# Patient Record
Sex: Male | Born: 1990 | Hispanic: No | Marital: Single | Smoking: Former smoker
Health system: Southern US, Community
[De-identification: ages and names within clinical notes are randomized; demographics above are authoritative.]

## PROBLEM LIST (undated history)

## (undated) DIAGNOSIS — K219 Gastro-esophageal reflux disease without esophagitis: Secondary | ICD-10-CM

## (undated) DIAGNOSIS — T7840XA Allergy, unspecified, initial encounter: Secondary | ICD-10-CM

## (undated) DIAGNOSIS — R519 Headache, unspecified: Secondary | ICD-10-CM

## (undated) DIAGNOSIS — R51 Headache: Secondary | ICD-10-CM

## (undated) DIAGNOSIS — E079 Disorder of thyroid, unspecified: Secondary | ICD-10-CM

## (undated) DIAGNOSIS — K222 Esophageal obstruction: Secondary | ICD-10-CM

## (undated) HISTORY — DX: Gastro-esophageal reflux disease without esophagitis: K21.9

## (undated) HISTORY — DX: Disorder of thyroid, unspecified: E07.9

## (undated) HISTORY — PX: LAPAROSCOPIC GASTRIC SLEEVE RESECTION: SHX5895

## (undated) HISTORY — DX: Esophageal obstruction: K22.2

## (undated) HISTORY — PX: WISDOM TOOTH EXTRACTION: SHX21

## (undated) HISTORY — DX: Headache: R51

## (undated) HISTORY — PX: TONSILLECTOMY: SUR1361

## (undated) HISTORY — DX: Headache, unspecified: R51.9

## (undated) HISTORY — DX: Allergy, unspecified, initial encounter: T78.40XA

## (undated) HISTORY — PX: UPPER GASTROINTESTINAL ENDOSCOPY: SHX188

---

## 2010-04-17 ENCOUNTER — Ambulatory Visit: Payer: Self-pay | Admitting: Family Medicine

## 2010-04-17 DIAGNOSIS — F17200 Nicotine dependence, unspecified, uncomplicated: Secondary | ICD-10-CM

## 2010-04-17 DIAGNOSIS — H6120 Impacted cerumen, unspecified ear: Secondary | ICD-10-CM

## 2010-06-11 NOTE — Assessment & Plan Note (Signed)
Summary: NEW ACUTE/EAR INF/PT TO EST LATER/[facility]   Vital Signs:  Patient profile:   20 year old male Weight:      245 pounds O2 Sat:      97 % Temp:     98.4 degrees F Pulse rate:   83 / minute BP sitting:   120 / 86  (left arm) Cuff size:   large  Vitals Entered By: Pura Spice, RN (April 17, 2010 12:18 PM) CC: acute  rt ear infection states very painful   History of Present Illness: New pt seen for acute issue of 2 day hx R ear pain. Hx cerumen impaction in past.  Used OTC ear drop last night-without relief. No hearing changes.  No alleviating factors. No exacerbating factors for ear pain.  No chronic med problems. PMH, SH , and FH reviewed.  Preventive Screening-Counseling & Management  Alcohol-Tobacco     Smoking Status: current  Allergies (verified): No Known Drug Allergies  Past History:  Social History: Last updated: 04/17/2010 Single Current Smoker  Risk Factors: Smoking Status: current (04/17/2010)  Past Medical History: no chronic problems.  Past Surgical History: tonsillectomy PMH-FH-SH reviewed for relevance  Social History: Single Current Smoker Smoking Status:  current  Review of Systems  The patient denies anorexia, fever, decreased hearing, and hoarseness.    Physical Exam  General:  Well-developed,well-nourished,in no acute distress; alert,appropriate and cooperative throughout examination Head:  Normocephalic and atraumatic without obvious abnormalities. No apparent alopecia or balding. Eyes:  pupils equal, pupils round, and pupils reactive to light.   Ears:  cerumen impaction R canal.  L normal. After irrigation, TM normal with exception of minimal erythema. Mouth:  Oral mucosa and oropharynx without lesions or exudates.  Teeth in good repair. Neck:  No deformities, masses, or tenderness noted. Lungs:  Normal respiratory effort, chest expands symmetrically. Lungs are clear to auscultation, no crackles or wheezes. Heart:   Normal rate and regular rhythm. S1 and S2 normal without gallop, murmur, click, rub or other extra sounds. Skin:  no rashes.   Cervical Nodes:  No lymphadenopathy noted   Impression & Recommendations:  Problem # 1:  CERUMEN IMPACTION (ICD-380.4) improved with irrigation.  TMs appear normal.  Problem # 2:  CIGARETTE SMOKER (ICD-305.1) rec smoking cessation.   Orders Added: 1)  New Patient Level II [99202]

## 2010-07-06 ENCOUNTER — Encounter: Payer: Self-pay | Admitting: Family Medicine

## 2010-07-06 ENCOUNTER — Ambulatory Visit (INDEPENDENT_AMBULATORY_CARE_PROVIDER_SITE_OTHER): Payer: PRIVATE HEALTH INSURANCE | Admitting: Family Medicine

## 2010-07-06 VITALS — BP 120/70 | Temp 98.8°F | Ht 69.5 in | Wt 260.0 lb

## 2010-07-06 DIAGNOSIS — M79673 Pain in unspecified foot: Secondary | ICD-10-CM

## 2010-07-06 DIAGNOSIS — M79609 Pain in unspecified limb: Secondary | ICD-10-CM

## 2010-07-06 MED ORDER — NAPROXEN 500 MG PO TABS: 500.0000 mg | ORAL_TABLET | Freq: Two times a day (BID) | ORAL | Status: DC

## 2010-07-06 NOTE — Patient Instructions (Signed)
Ice foot 2-3 times daily. Use good supportive shoewear. Touch base in 3 weeks if no better.

## 2010-07-06 NOTE — Progress Notes (Signed)
  Subjective:    Patient ID: Keith Copeland, male    DOB: 1990/09/16, 20 y.o.   MRN: [id number]  HPI  patient seen with right dorsal foot pain one half week duration. No injury. Works at the [employer] on feet several hours per day. Pain is more forefoot area. No redness or warmth. No swelling. Has taken no medications. Pain worse when pushing off. No prior history of foot problem. Pain is moderate severity   Review of Systems  as per history of present illness    Objective:   Physical Exam  the patient is alert and in no distress Chest clear to auscultation Heart regular rhythm and rate  right lower extremity foot exam reveals no edema, erythema, or warmth. No ecchymosis. Minimally tender but poorly localized proximal foot dorsally. No metatarsal tenderness. Full range of motion of ankle no Achilles tenderness. No tibia or fibula tenderness       Assessment & Plan:   probable tendinitis right foot.   Recommend good supportive shoe wear. Ice 2-3 times daily. Naproxen 500 mg twice a day with food.3 weeks if no better

## 2010-10-18 ENCOUNTER — Emergency Department (HOSPITAL_COMMUNITY)
Admission: EM | Admit: 2010-10-18 | Discharge: 2010-10-19 | Disposition: A | Discharge status: Home / Self Care | Payer: PRIVATE HEALTH INSURANCE | Attending: Emergency Medicine | Admitting: Emergency Medicine

## 2010-10-18 DIAGNOSIS — T18108A Unspecified foreign body in esophagus causing other injury, initial encounter: Secondary | ICD-10-CM | POA: Insufficient documentation

## 2010-10-18 DIAGNOSIS — IMO0002 Reserved for concepts with insufficient information to code with codable children: Secondary | ICD-10-CM | POA: Insufficient documentation

## 2010-10-19 ENCOUNTER — Emergency Department (HOSPITAL_COMMUNITY): Payer: PRIVATE HEALTH INSURANCE

## 2010-10-19 ENCOUNTER — Encounter: Payer: Self-pay | Admitting: Internal Medicine

## 2010-10-19 LAB — BASIC METABOLIC PANEL
Calcium: 9.6 mg/dL (ref 8.4–10.5)
GFR calc Af Amer: >60 mL/min (ref >60)
GFR calc non Af Amer: >60 mL/min (ref >60)
Sodium: 138 mEq/L (ref 135–145)

## 2010-10-19 LAB — CBC
MCH: 30.4 pg (ref 26.0–34.0)
MCHC: 34.4 g/dL (ref 30.0–36.0)
Platelets: 192 10*3/uL (ref 150–400)
RBC: 4.6 MIL/uL (ref 4.22–5.81)
RDW: 12.9 % (ref 11.5–15.5)

## 2010-11-04 ENCOUNTER — Ambulatory Visit (INDEPENDENT_AMBULATORY_CARE_PROVIDER_SITE_OTHER): Payer: PRIVATE HEALTH INSURANCE | Admitting: Family Medicine

## 2010-11-04 ENCOUNTER — Encounter: Payer: Self-pay | Admitting: Family Medicine

## 2010-11-04 VITALS — BP 120/80 | Temp 98.0°F | Wt 265.0 lb

## 2010-11-04 DIAGNOSIS — B356 Tinea cruris: Secondary | ICD-10-CM

## 2010-11-04 MED ORDER — NAFTIFINE HCL 1 % EX GEL: 1.0000 "application " | Freq: Two times a day (BID) | CUTANEOUS | Status: DC

## 2010-11-04 NOTE — Patient Instructions (Signed)
Tinea Cruris (Jock Itch, Fungal Infection of the Groin) Tinea Cruris (Jock Itch) is a fungal infection of the skin in the groin area. It is sometimes called "ringworm" even though it is not due to a worm. A fungus is a type of germ that thrives in dark, damp places.  CAUSES This infection may spread from:  A fungus infection elsewhere on the body (such as athlete's foot)   Sharing towels, clothing, etc.  This infection is more common in:  Hot, humid climates.   People who wear tight-fitting clothing or wet bathing suits for long periods of time.   Athletes.   Overweight people.   Diabetics.  SYMPTOMS Tinea cruris causes the following symptoms:  Red, pink or brown rash in the groin. Rash may spread to the thighs, anus and buttocks.   Itching  DIAGNOSIS Your caregiver may make the diagnosis by looking at the rash. Sometimes a skin scraping will be sent to test for fungus. Testing can be done either by looking under the microscope or by doing a culture (test to try to grow the fungus). A culture can take up to 2 weeks to come back. TREATMENT Tinea cruris may be treated with:  Skin cream or ointment to kill fungus.   Medicine by mouth to kill fungus.   Skin cream or ointment to calm the itching.   Compresses or medicated powders to dry the infected skin.  HOME CARE INSTRUCTIONS  Be sure to treat the rash completely - follow your caregiver's instructions. It can take a couple of weeks to treat. If you do not treat long enough, the rash can come back.   Wear loose fitting clothing.   Men should wear cotton boxer shorts   Women should wear cotton underwear.   Avoid hot baths.   Dry the groin area well after bathing.  SEEK MEDICAL CARE IF:  The rash is worse.   The rash is spreading.   The rash returns after treatment is finished.   The rash is not gone in 4 weeks. Fungal infections are slow to respond to treatment. Some redness may remain for several weeks after  the fungus is gone.   You develop a fever of more than 100.83F (38.1 C).  SEEK IMMEDIATE MEDICAL ATTENTION IF:  The area becomes red, warm, tender, and swollen.   An unexplained oral temperature above 100 develops.  Document Released: 04/16/2002 Document Re-Released: 07/21/2009 [facility] Patient Information 2011 [place], [place].  May consider trying Lotrimin or Lamisil over the counter twice daily and go to Naftin if no better with those.

## 2010-11-04 NOTE — Progress Notes (Signed)
  Subjective:    Patient ID: Keith Copeland, male    DOB: [birth year], 20 y.o.   MRN: [id number]  HPI Two-week history of rash groin region. Rash is pruritic. No pustules. Minimally scaly. Works and he and frequent sweats a lot. Has tried Desitin, cortisone and Gold Bond without improvement. No associated pain. No alleviating factors.   Review of Systems  Constitutional: Negative for fever and chills.  Skin: Positive for rash.       Objective:   Physical Exam  Constitutional: He appears well-developed and well-nourished. No distress.  Cardiovascular: Normal rate, regular rhythm and normal heart sounds.   Pulmonary/Chest: Effort normal and breath sounds normal. No respiratory distress. He has no wheezes. He has no rales.  Musculoskeletal: He exhibits no edema.  Skin:       Patient has rash in groin region symmetric distribution. Erythematous base with some satellite lesions. No pustules. Minimally scaly.          Assessment & Plan:  Tenia cruris. Prevention measures discussed. Try over-the-counter Lotrimin or Lamisil cream. No relief try Naftin gel.

## 2010-12-01 ENCOUNTER — Encounter: Payer: Self-pay | Admitting: Internal Medicine

## 2010-12-01 ENCOUNTER — Ambulatory Visit (INDEPENDENT_AMBULATORY_CARE_PROVIDER_SITE_OTHER): Payer: PRIVATE HEALTH INSURANCE | Admitting: Internal Medicine

## 2010-12-01 DIAGNOSIS — R1310 Dysphagia, unspecified: Secondary | ICD-10-CM

## 2010-12-01 DIAGNOSIS — K219 Gastro-esophageal reflux disease without esophagitis: Secondary | ICD-10-CM

## 2010-12-01 MED ORDER — OMEPRAZOLE 40 MG PO CPDR: 40.0000 mg | DELAYED_RELEASE_CAPSULE | Freq: Every day | ORAL | Status: DC

## 2010-12-01 NOTE — Patient Instructions (Signed)
EGD with propoful [facility] 12/08/10 4:00 pm arrive at 3:00 pm on 4th floor Endoscopy brochure given along with Dilation information.

## 2010-12-01 NOTE — Progress Notes (Signed)
HISTORY OF PRESENT ILLNESS:  Keith Copeland is a 20 y.o. male with morbid obesity who presents today, upon referral from the emergency room physician, regarding chronic intermittent problems with dysphagia. He is accompanied by his girlfriend. Patient reports at least a 6 year history of intermittent solid food dysphagia. Severe at times. Always requiring liquids along with his meals. Eats very slowly. Evaluated in [place] by a non-specialist. Apparently had a barium swallow and was recommended to have tonsillectomy, which he didn't 4 years ago. This did not change his symptoms. No other evaluation. He does have occasional intermittent problems with heartburn which has been worse recently. He was seen in the emergency room for an acute food impaction, which resolved spontaneously, around 10/19/2010. CBC and comprehensive metabolic panel were unremarkable.  REVIEW OF SYSTEMS:  All non-GI ROS negative.  Past Medical History  Diagnosis Date  . Dysphagia   . Diabetes mellitus     diet controlled/weight loss    Past Surgical History  Procedure Date  . Tonsillectomy     Social History Jakori Burkett  reports that he has been smoking Cigarettes.  He has a .3 pack-year smoking history. He does not have any smokeless tobacco history on file. He reports that he does not drink alcohol or use illicit drugs.  family history includes Breast cancer in his paternal grandmother and Diabetes in his paternal grandmother.  No Known Allergies     PHYSICAL EXAMINATION: Vital signs: BP 124/82  Pulse 90  Ht 5\' 9"  (1.753 m)  Wt 266 lb (120.657 kg)  BMI 39.28 kg/m2  Constitutional: generally well-appearing but obese, no acute distress Psychiatric: alert and oriented x3, cooperative Eyes: extraocular movements intact, anicteric, conjunctiva pink Mouth: oral pharynx moist, no lesions Neck: supple no lymphadenopathy Cardiovascular: heart regular rate and rhythm, no murmur Lungs: clear to auscultation  bilaterally Abdomen: soft, obese, nontender, nondistended, no obvious ascites, no peritoneal signs, normal bowel sounds, no organomegaly Extremities: no lower extremity edema bilaterally Skin: no lesions on visible extremities Neuro: No focal deficits.   ASSESSMENT:  #1. Intermittent solid food dysphagia with recurrent food impaction. Rule out eosinophilic esophagitis. Rule out peptic stricture. We discussed these entities in detail. #2. GERD  PLAN:  #1. Upper endoscopy with probable esophageal dilation.The nature of the procedure, as well as the risks, benefits, and alternatives were carefully and thoroughly reviewed with the patient. Ample time for discussion and questions allowed. The patient understood, was satisfied, and agreed to proceed. Patient would be most appropriately served with propofol/CRNA due to his obesity I discussed this with him. #2. Prescribe omeprazole 40 mg daily #3. Reflux percussions with attention to weight loss

## 2010-12-08 ENCOUNTER — Encounter: Payer: Self-pay | Admitting: *Deleted

## 2010-12-08 ENCOUNTER — Encounter: Payer: Self-pay | Admitting: Internal Medicine

## 2010-12-08 ENCOUNTER — Ambulatory Visit (AMBULATORY_SURGERY_CENTER): Payer: PRIVATE HEALTH INSURANCE | Admitting: Internal Medicine

## 2010-12-08 VITALS — BP 109/53 | HR 63 | Temp 97.3°F | Resp 19 | Ht 69.5 in | Wt 260.0 lb

## 2010-12-08 DIAGNOSIS — K2980 Duodenitis without bleeding: Secondary | ICD-10-CM

## 2010-12-08 DIAGNOSIS — K222 Esophageal obstruction: Secondary | ICD-10-CM

## 2010-12-08 MED ORDER — SODIUM CHLORIDE 0.9 % IV SOLN: 500.0000 mL | INTRAVENOUS | Status: DC

## 2010-12-08 NOTE — Patient Instructions (Signed)
See the picture page for the findings of your upper endoscopy.  Please follow the instructions sheets that are green and blue the rest of the day.  Also follow the dilatation diet the rest of the day.  Resume your prior medicationsincluding the OMEPRAZOLE 40 mg daily.  Please call the office tomarrow to set up a follow up visit with Dr. Marina Goodell for 4-6 weeks.  Call if any questions or concerns.

## 2010-12-08 NOTE — Progress Notes (Signed)
No complaint on discharge.  MAW

## 2010-12-09 ENCOUNTER — Telehealth: Payer: Self-pay | Admitting: *Deleted

## 2010-12-09 NOTE — Telephone Encounter (Signed)
Left message to call us if needed.

## 2010-12-10 DIAGNOSIS — K2980 Duodenitis without bleeding: Secondary | ICD-10-CM

## 2010-12-10 LAB — HELICOBACTER PYLORI SCREEN-BIOPSY: UREASE: NEGATIVE

## 2011-01-06 ENCOUNTER — Ambulatory Visit: Payer: PRIVATE HEALTH INSURANCE | Admitting: Internal Medicine

## 2011-01-06 ENCOUNTER — Encounter: Payer: Self-pay | Admitting: *Deleted

## 2011-04-30 ENCOUNTER — Encounter: Payer: Self-pay | Admitting: Internal Medicine

## 2011-04-30 ENCOUNTER — Ambulatory Visit (INDEPENDENT_AMBULATORY_CARE_PROVIDER_SITE_OTHER): Payer: PRIVATE HEALTH INSURANCE | Admitting: Internal Medicine

## 2011-04-30 DIAGNOSIS — K200 Eosinophilic esophagitis: Secondary | ICD-10-CM

## 2011-04-30 DIAGNOSIS — R1310 Dysphagia, unspecified: Secondary | ICD-10-CM

## 2011-04-30 DIAGNOSIS — K222 Esophageal obstruction: Secondary | ICD-10-CM

## 2011-04-30 NOTE — Patient Instructions (Signed)
You have been scheduled for an endoscopy with propofol. Please follow written instructions given to you at your visit today.  

## 2011-04-30 NOTE — Progress Notes (Signed)
HISTORY OF PRESENT ILLNESS:  Keith Copeland is a 20 y.o. male with chronic recurrent dysphagia. He was last seen on 12/08/2010 when he underwent upper endoscopy. Examination revealed esophageal changes consistent with eosinophilic esophagitis. He was dilated with 48 and 52 [place] dilators. He was prescribed omeprazole 40 mg daily and asked to followup in 4-6 weeks. He did not followup. He presents today with recurrent intermittent solid food dysphagia 3-4 weeks' duration. He is accompanied by his girlfriend. He has been suboptimally compliant with omeprazole stating that he takes it once or twice per week. No classic GERD symptoms. No interval problems.  REVIEW OF SYSTEMS:  All non-GI ROS negative.  Past Medical History  Diagnosis Date  . Dysphagia   . Allergy     poison ivy/oak  . Esophagitis   . Duodenitis   . Esophageal ring     Past Surgical History  Procedure Date  . Tonsillectomy     Social History Keith Copeland  reports that he has been smoking Cigarettes.  He has a .3 pack-year smoking history. He has never used smokeless tobacco. He reports that he drinks about 1.8 ounces of alcohol per week. He reports that he does not use illicit drugs.  family history includes Breast cancer in his paternal grandmother and Diabetes in his paternal grandmother.  No Known Allergies     PHYSICAL EXAMINATION: Vital signs: BP 124/72  Pulse 60  Ht 5\' 9"  (1.753 m)  Wt 263 lb (119.296 kg)  BMI 38.84 kg/m2 General: Obese. Well-developed, well-nourished, no acute distress HEENT: Sclerae are anicteric, conjunctiva pink. Oral mucosa intact Lungs: Clear Heart: Regular Abdomen: soft, obese nontender, nondistended, no obvious ascites, no peritoneal signs, normal bowel sounds. No organomegaly. Extremities: No edema Psychiatric: alert and oriented x3. Cooperative   ASSESSMENT:  #1. Recurrent dysphagia likely due to recurrent esophageal stricture/ring formation. Prior endoscopy with changes  consistent with eosinophilic esophagitis.  PLAN:  #1. Instructed to take omeprazole 40 mg DAILY #2. Schedule upper endoscopy with esophageal dilation.The nature of the procedure, as well as the risks, benefits, and alternatives were carefully and thoroughly reviewed with the patient. Ample time for discussion and questions allowed. The patient understood, was satisfied, and agreed to proceed. chew food slowly and carefully in the interim

## 2011-05-10 ENCOUNTER — Encounter: Payer: Self-pay | Admitting: *Deleted

## 2011-05-10 ENCOUNTER — Ambulatory Visit (AMBULATORY_SURGERY_CENTER): Payer: PRIVATE HEALTH INSURANCE | Admitting: Internal Medicine

## 2011-05-10 ENCOUNTER — Encounter: Payer: Self-pay | Admitting: Internal Medicine

## 2011-05-10 VITALS — BP 119/78 | HR 67 | Temp 97.0°F | Resp 16 | Ht 69.0 in | Wt 263.0 lb

## 2011-05-10 DIAGNOSIS — R1310 Dysphagia, unspecified: Secondary | ICD-10-CM

## 2011-05-10 DIAGNOSIS — K219 Gastro-esophageal reflux disease without esophagitis: Secondary | ICD-10-CM

## 2011-05-10 DIAGNOSIS — K222 Esophageal obstruction: Secondary | ICD-10-CM

## 2011-05-10 MED ORDER — SODIUM CHLORIDE 0.9 % IV SOLN: 500.0000 mL | INTRAVENOUS | Status: DC

## 2011-05-10 NOTE — Op Note (Signed)
[facility] [address] [place], [place]  [address]  ENDOSCOPY PROCEDURE REPORT  PATIENT:  Keith Copeland, Keith Copeland  MR#:  [id number] BIRTHDATE:  Oct 03, 1990, 20 yrs. old  GENDER:  male  ENDOSCOPIST:  Wilhemina Bonito. Eda Keys, MD Referred by:  Office  PROCEDURE DATE:  05/10/2011 PROCEDURE:  EGD, diagnostic 43235, [place] Dilation of Esophagus - 48, 37F ASA CLASS:  Class I INDICATIONS:  dilation of esophageal stricture, dysphagia, GERD  MEDICATIONS:   MAC sedation, administered by CRNA, propofol (Diprivan) 300 mg IV TOPICAL ANESTHETIC:  none  DESCRIPTION OF PROCEDURE:   After the risks benefits and alternatives of the procedure were thoroughly explained, informed consent was obtained.  The LB GIF-H180 [id number] endoscope was introduced through the mouth and advanced to the second portion of the duodenum, without limitations.  The instrument was slowly withdrawn as the mucosa was fully examined. <<PROCEDUREIMAGES>>  Multiple rings were seen in the total esophagus as previous. Distal  15mm stricture with inflammation.  Otherwise the examination to D2 was normal.    Retroflexed views revealed no abnormalities.  THERAPY: 42F DILATOR PASSED W/ MINIMAL RESISTANCE. NO HEME. 37F DILATOR PASSED W/ MINIMAL RESISTANCE AND HEME. RELOOK EGD WITH CONTAINED WRENT IN DISTAL ESOPHAGUS  COMPLICATIONS:  None  ENDOSCOPIC IMPRESSION: 1) Rings, multiple in the total esophagus - S/P DILATION 2) Otherwise normal examination  RECOMMENDATIONS: 1) NPO UNTIL 12 NOON. Then Clear liquids until 5 pm, then soft foods rest of day. Resume prior diet tomorrow. 2) Continue OMEPRAZOLE DAILY 3) Follow-up o.v  IN 4-6 WEEKS  ______________________________ Wilhemina Bonito. Eda Keys, MD  CC:  The Patient;  Evelena Peat, MD  n. Rosalie DoctorWilhemina Bonito. Eda Keys at 05/10/2011 10:29 AM  [place], [place], [phone]

## 2011-05-10 NOTE — Progress Notes (Signed)
Patient did not experience any of the following events: a burn prior to discharge; a fall within the facility; wrong site/side/patient/procedure/implant event; or a hospital transfer or hospital admission upon discharge from the facility. (G8907) Patient did not have preoperative order for IV antibiotic SSI prophylaxis. (G8918)  

## 2011-05-10 NOTE — Patient Instructions (Signed)
FOLLOW THE DISCHARGE INSTRUCTIONS ON THE GREEN AND BLUE INSTRUCTION SHEETS.  FOLLOW DILATATION DIET:  NOTHING TO EAT OR DRINK UNTIL 1200 TODAY. CLEAR LIQUIDS ONLY FROM 1200 UNTIL 5 PM. AFTER 5 PM SOFT FOODS ONLY. RESUME YOUR REGULAR DIET TOMORROW.  CONTINUE OMPEPRAZOLE DAILY.  FOLLOW UP APPOINTMENT IN 4-6 WEEKS WITH DR. PERRY.

## 2011-05-12 ENCOUNTER — Telehealth: Payer: Self-pay

## 2011-05-12 NOTE — Telephone Encounter (Signed)
Follow up Call- Patient questions:  Do you have a fever, pain , or abdominal swelling? no Pain Score  0 *  Have you tolerated food without any problems? yes  Have you been able to return to your normal activities? yes  Do you have any questions about your discharge instructions: Diet   no Medications  no Follow up visit  no  Do you have questions or concerns about your Care? no  Actions: * If pain score is 4 or above: No action needed, pain <4.  I asked how the pt was today he commented "I'm better now".  He said he had chest pain after his procedure.  I asked if he called the emergency number on his discharge papers.  He said "no, it went away".  I asked if he was having any chest pain now, he replied, "no, not now".  I advised him to call us back if the chest pain came back or if any other questions or concerns.  MAW

## 2011-06-07 ENCOUNTER — Encounter: Payer: Self-pay | Admitting: Internal Medicine

## 2011-06-07 ENCOUNTER — Ambulatory Visit (INDEPENDENT_AMBULATORY_CARE_PROVIDER_SITE_OTHER): Payer: PRIVATE HEALTH INSURANCE | Admitting: Internal Medicine

## 2011-06-07 DIAGNOSIS — K219 Gastro-esophageal reflux disease without esophagitis: Secondary | ICD-10-CM

## 2011-06-07 DIAGNOSIS — K200 Eosinophilic esophagitis: Secondary | ICD-10-CM

## 2011-06-07 DIAGNOSIS — K222 Esophageal obstruction: Secondary | ICD-10-CM

## 2011-06-07 DIAGNOSIS — R1310 Dysphagia, unspecified: Secondary | ICD-10-CM

## 2011-06-07 NOTE — Patient Instructions (Signed)
Please follow up as needed 

## 2011-06-07 NOTE — Progress Notes (Signed)
HISTORY OF PRESENT ILLNESS:  Keith Copeland is a 21 y.o. male with eosinophilic esophagitis and GERD complicated by multiple esophageal rings and stricture with associated significant dysphagia. He was last seen 05/10/2011 when he underwent upper endoscopy with [facility] dilation of the esophagus with 48 and 52 [place] dilators. Post dilation he has been continued on omeprazole 40 mg daily. He presents today for followup. He is pleased to report marked improvement in his swallowing. Really having no problems. He has been compliant with omeprazole. Has noticed increased belching.  REVIEW OF SYSTEMS:  All non-GI ROS negative.  Past Medical History  Diagnosis Date  . Dysphagia   . Allergy     poison ivy/oak  . Esophagitis   . Duodenitis   . Esophageal ring     Past Surgical History  Procedure Date  . Tonsillectomy     Social History Keith Copeland  reports that he quit smoking about 4 months ago. His smoking use included Cigarettes. He has a .3 pack-year smoking history. He has never used smokeless tobacco. He reports that he drinks about 1.8 ounces of alcohol per week. He reports that he does not use illicit drugs.  family history includes Breast cancer in his paternal grandmother and Diabetes in his paternal grandmother.  No Known Allergies     PHYSICAL EXAMINATION: Vital signs: BP 110/62  Pulse 76  Ht 5\' 9"  (1.753 m)  Wt 270 lb (122.471 kg)  BMI 39.87 kg/m2 General: Well-developed, well-nourished, no acute distress HEENT: Sclerae are anicteric, conjunctiva pink. Oral mucosa intact Lungs: Clear Heart: Regular Abdomen: soft, nontender, nondistended, no obvious ascites, no peritoneal signs, normal bowel sounds. No organomegaly. Extremities: No edema Psychiatric: alert and oriented x3. Cooperative     ASSESSMENT:  #1. Eosinophilic esophagitis with ringed esophagus #2. GERD with peptic stricture #3. Status post esophageal dilation with improvement in  dysphagia  PLAN:  #1. Continue reflux precautions #2. Continue omeprazole 40 mg daily #3. Return as needed for recurrent dysphagia. High likelihood with this diagnosis. Otherwise, routine followup in one year

## 2011-08-11 ENCOUNTER — Encounter: Payer: Self-pay | Admitting: Family Medicine

## 2011-08-11 ENCOUNTER — Ambulatory Visit (INDEPENDENT_AMBULATORY_CARE_PROVIDER_SITE_OTHER): Payer: PRIVATE HEALTH INSURANCE | Admitting: Family Medicine

## 2011-08-11 VITALS — BP 120/70 | Temp 98.9°F | Wt 280.0 lb

## 2011-08-11 DIAGNOSIS — N529 Male erectile dysfunction, unspecified: Secondary | ICD-10-CM

## 2011-08-11 MED ORDER — TADALAFIL 20 MG PO TABS: ORAL_TABLET | ORAL | Status: DC

## 2011-08-11 NOTE — Progress Notes (Signed)
  Subjective:    Patient ID: Keith Copeland, male    DOB: [birth year], 21 y.o.   MRN: [id number]  HPI  Patient seen with recent problems of erectile dysfunction. He has good libido. He quit smoking recently. No excessive alcohol use. No illicit drug use. He is able to get erection but has difficulty maintaining. No history of diabetes. No history of any vascular issues. Denies depression. Only other medication is omeprazole for reflux.   Review of Systems  Constitutional: Negative for appetite change and unexpected weight change.  Respiratory: Negative for shortness of breath.   Cardiovascular: Negative for chest pain.  Genitourinary: Negative for dysuria, hematuria, penile swelling, penile pain and testicular pain.       Objective:   Physical Exam  Constitutional: He appears well-developed and well-nourished.  Neck: Neck supple. No thyromegaly present.  Cardiovascular: Normal rate and regular rhythm.   No murmur heard. Pulmonary/Chest: Effort normal and breath sounds normal. No respiratory distress. He has no wheezes. He has no rales.  Musculoskeletal: He exhibits no edema.  Lymphadenopathy:    He has no cervical adenopathy.          Assessment & Plan:  Erectile dysfunction. We discussed various factors. Suspect he has some non-organic factors. Continue to abstain from nicotine. Work on regular exercise and weight loss. Trial of Cialis 20 mg one half to one tablet every other day as needed and I suspect he'll be able to taper off soon. Consider further evaluation if not seeing great response with medication

## 2011-09-17 ENCOUNTER — Ambulatory Visit (INDEPENDENT_AMBULATORY_CARE_PROVIDER_SITE_OTHER): Payer: PRIVATE HEALTH INSURANCE | Admitting: Family Medicine

## 2011-09-17 ENCOUNTER — Encounter: Payer: Self-pay | Admitting: Family Medicine

## 2011-09-17 VITALS — BP 140/88 | Temp 98.8°F | Wt 280.0 lb

## 2011-09-17 DIAGNOSIS — G44229 Chronic tension-type headache, not intractable: Secondary | ICD-10-CM

## 2011-09-17 MED ORDER — CYCLOBENZAPRINE HCL 5 MG PO TABS: ORAL_TABLET | ORAL | Status: DC

## 2011-09-17 NOTE — Patient Instructions (Signed)
Tension Headache (Muscle Contraction Headache) Tension headache is one of the most common causes of head pain. These headaches are usually felt as a pain over the top of your head and back of your neck. Stress, anxiety, and depression are common triggers for these headaches. Tension headaches are not life-threatening and will not lead to other types of headaches. Tension headaches can often be diagnosed by taking a history from the patient and a physical exam. Sometimes, further lab and x-ray studies are used to confirm the diagnosis. Your caregiver can advise you on how to get help solving problems that cause anxiety or stress. Antidepressants can be prescribed if depression is a problem. HOME CARE INSTRUCTIONS   If testing was done, call for your results. Remember, it is your responsibility to get the results of all testing. Do not assume everything is fine because you do not hear from your caregiver.   Only take over-the-counter or prescription medicines for pain, discomfort, or fever as directed by your caregiver.   Biofeedback, massage, or other relaxation techniques may be helpful.   Ice packs or heat to the head and neck can be used. Use these three to four times per day or as needed.   Physical therapy may be a useful addition to treatment.   If headaches continue, even with therapy, you may need to think about lifestyle changes.   Avoid excessive use of pain killers, as rebound headaches can occur.  SEEK MEDICAL CARE IF:   You develop problems with medications prescribed.   You do not respond or get no relief from medications.   You have a change from the usual headache.   You develop nausea (feeling sick to your stomach) or vomiting.  SEEK IMMEDIATE MEDICAL CARE IF:   Your headache becomes severe.   You have an unexplained oral temperature above 102 F (38.9 C).   You develop a stiff neck.   You have loss of vision.   You have muscular weakness.   You have loss of  muscular control.   You develop severe symptoms different from your first symptoms.   You start losing your balance or have trouble walking.   You feel faint or pass out.  MAKE SURE YOU:   Understand these instructions.   Will watch your condition.   Will get help right away if you are not doing well or get worse.  Document Released: 04/26/2005 Document Revised: 04/15/2011 Document Reviewed: 12/14/2007 [facility] Patient Information 2012 [place], [place].  Avoid regular use of analgesics for headache

## 2011-09-17 NOTE — Progress Notes (Signed)
  Subjective:    Patient ID: Keith Copeland, male    DOB: [birth year], 21 y.o.   MRN: [id number]  HPI  Patient seen with headaches for the past couple of weeks. Location is somewhat bilateral. Starts occipital and spreads frontal region. Relatively constant and daily for 2 weeks. Had prior history reported migraines and these are somewhat different. He is not having nausea or vomiting. No photophobia. No visual disturbance. No focal weakness. No known injury. Denies any fever chills. No stiff neck. He has had some neck soreness. Job requires a lot of lifting. In general poor sleep. About 6 hours sleep per night. He's been taking acetaminophen up to 5 500 mg tablets twice daily which helps temporarily. He is cautioned about not taking regular analgesics especially not using excessive acetaminophen. Denies any focal weakness or any focal neurologic symptoms. No exertional headache.  Past Medical History  Diagnosis Date  . Dysphagia   . Allergy     poison ivy/oak  . Esophagitis   . Duodenitis   . Esophageal ring    Past Surgical History  Procedure Date  . Tonsillectomy     reports that he quit smoking about 7 months ago. His smoking use included Cigarettes. He has a .3 pack-year smoking history. He has never used smokeless tobacco. He reports that he drinks about 1.8 ounces of alcohol per week. He reports that he does not use illicit drugs. family history includes Breast cancer in his paternal grandmother and Diabetes in his paternal grandmother. No Known Allergies    Review of Systems  Constitutional: Negative for fever, chills, appetite change, fatigue and unexpected weight change.  Eyes: Negative for visual disturbance.  Respiratory: Negative for cough.   Cardiovascular: Negative for chest pain.  Neurological: Positive for headaches. Negative for dizziness, weakness and numbness.  Psychiatric/Behavioral: Negative for dysphoric mood.       Objective:   Physical Exam  Constitutional:  He is oriented to person, place, and time. He appears well-developed and well-nourished. No distress.  Neck: Neck supple. No thyromegaly present.  Cardiovascular: Normal rate and regular rhythm.   Pulmonary/Chest: Effort normal and breath sounds normal. No respiratory distress. He has no wheezes. He has no rales.  Musculoskeletal: He exhibits no edema.  Lymphadenopathy:    He has no cervical adenopathy.  Neurological: He is alert and oriented to person, place, and time. No cranial nerve deficit. Coordination normal.  Psychiatric: He has a normal mood and affect.          Assessment & Plan:  Headaches. Suspect chronic tension-type headache. Avoid regular use of analgesics. Start Flexeril 5 mg 1-2 each bedtime. Try conservative measures such as heat and muscle massage.  Consider low-dose try cyclic if not seeing improvement with the above of the next couple of weeks

## 2012-10-05 ENCOUNTER — Telehealth: Payer: Self-pay | Admitting: Internal Medicine

## 2012-10-06 ENCOUNTER — Telehealth: Payer: Self-pay

## 2012-10-06 NOTE — Telephone Encounter (Signed)
Pt called and states he wants to schedule a procedure to have his "throat stretched again." States it is getting difficult to swallow again. Pt last seen 06/07/11, last EGD 05/10/11. Dr. Marina Goodell do you want the pt to have an OV first? Please advise.

## 2012-10-06 NOTE — Telephone Encounter (Signed)
He can be set up for EGD with dilation in the [facility] directly. Make sure he is taking omeprazole 40 mg daily, every day. Thank you

## 2012-10-06 NOTE — Telephone Encounter (Signed)
Called pt to see if he could come for EGD on Monday. Pt states he can come for EGD at 3pm for 4pm EGD on Monday. Pt instructed to have clear liquids only after midnight. Pt verbalized understanding.

## 2012-10-06 NOTE — Telephone Encounter (Signed)
Pt states he is taking omeprazole 40mg  daily. Pt scheduled for previsit 10/09/12@8am , EGD with dil 10/11/12@4pm  in the [facility]. Pt aware of appt dates and times.

## 2012-10-09 ENCOUNTER — Encounter: Payer: Self-pay | Admitting: Internal Medicine

## 2012-10-09 ENCOUNTER — Ambulatory Visit (AMBULATORY_SURGERY_CENTER): Payer: PRIVATE HEALTH INSURANCE | Admitting: *Deleted

## 2012-10-09 ENCOUNTER — Ambulatory Visit (AMBULATORY_SURGERY_CENTER): Payer: PRIVATE HEALTH INSURANCE | Admitting: Internal Medicine

## 2012-10-09 VITALS — Ht 69.5 in | Wt 285.4 lb

## 2012-10-09 VITALS — BP 105/62 | HR 62 | Temp 96.9°F | Resp 19 | Ht 69.0 in | Wt 285.0 lb

## 2012-10-09 DIAGNOSIS — K2980 Duodenitis without bleeding: Secondary | ICD-10-CM

## 2012-10-09 DIAGNOSIS — K222 Esophageal obstruction: Secondary | ICD-10-CM

## 2012-10-09 DIAGNOSIS — R1310 Dysphagia, unspecified: Secondary | ICD-10-CM

## 2012-10-09 MED ORDER — SODIUM CHLORIDE 0.9 % IV SOLN: 500.0000 mL | INTRAVENOUS | Status: DC

## 2012-10-09 MED ORDER — OMEPRAZOLE 40 MG PO CPDR: 40.0000 mg | DELAYED_RELEASE_CAPSULE | Freq: Every day | ORAL | Status: AC

## 2012-10-09 NOTE — Progress Notes (Signed)
Patient did not experience any of the following events: a burn prior to discharge; a fall within the facility; wrong site/side/patient/procedure/implant event; or a hospital transfer or hospital admission upon discharge from the facility. (G8907) Patient did not have preoperative order for IV antibiotic SSI prophylaxis. (G8918)  

## 2012-10-09 NOTE — Op Note (Signed)
[facility] [address] [place], [address]   ENDOSCOPY PROCEDURE REPORT  PATIENT: Jovanne, Riggenbach  MR#: [id number] BIRTHDATE: 18-Oct-1990 , 21  yrs. old GENDER: Male ENDOSCOPIST: Roxy Cedar, MD REFERRED BY:  .  [employer] PROCEDURE DATE:  10/09/2012 PROCEDURE:  EGD w/ biopsy and Maloney dilation of esophagus - 73 F ASA CLASS:     Class II INDICATIONS:  Dysphagia.   Therapeutic procedure. last dilation 05/10/2011. Six-month history of intermittent solid food dysphagia. Has not been taking PPI regularly. MEDICATIONS: MAC sedation, administered by CRNA and propofol (Diprivan) 300mg  IV TOPICAL ANESTHETIC: Cetacaine Spray  DESCRIPTION OF PROCEDURE: After the risks benefits and alternatives of the procedure were thoroughly explained, informed consent was obtained.  The LB [id number] endoscope was introduced through the mouth and advanced to the second portion of the duodenum. Without limitations.  The instrument was slowly withdrawn as the mucosa was fully examined.      EXAM:The esophagus revealed furrows and multiple rings.  No acute inflammation or high-grade stricture.  The stomach was normal.  The duodenal bulb revealed multiple superficial erosions.  Post bulbar duodenum was normal.  CLO biopsy was taken.  Retroflexed views revealed no abnormalities.     The scope was then withdrawn from the patient and the procedure completed.  THERAPY: The 18 French Maloney dilator was passed blindly into the esophagus. Minimal resistance encountered. No heme present upon removal. Relook endoscopy revealed several linear tears which were contained.  COMPLICATIONS: There were no complications. ENDOSCOPIC IMPRESSION: 1. Esophageal rings status post dilation. 2. Duodenitis 3. GERD/ EOE  RECOMMENDATIONS: 1.  NPO for 2 hours, thenClear liquids until 8 PM, then soft foods rest of day.  Resume prior diet tomorrow. 2.  Prescribe omeprazole 40 mg by mouth  daily; #30; 11 refills 3. Follow up CLO biopsy and treat if positive 4. GI followup as needed for recurrent dysphagia REPEAT EXAM:    eSigned:  Roxy Cedar, MD 10/09/2012 4:43 PM FA:OZHYQ Burchette, MD and The Patient

## 2012-10-09 NOTE — Progress Notes (Signed)
Procedure ends, to recovery awake, report given and VSS. 

## 2012-10-09 NOTE — Progress Notes (Signed)
Called to room to assist during endoscopic procedure.  Patient ID and intended procedure confirmed with present staff. Received instructions for my participation in the procedure from the performing physician.  

## 2012-10-09 NOTE — Patient Instructions (Addendum)
YOU HAD AN ENDOSCOPIC PROCEDURE TODAY AT THE [facility]: Refer to the procedure report that was given to you for any specific questions about what was found during the examination.  If the procedure report does not answer your questions, please call your gastroenterologist to clarify.  If you requested that your care partner not be given the details of your procedure findings, then the procedure report has been included in a sealed envelope for you to review at your convenience later.  YOU SHOULD EXPECT: Some feelings of bloating in the abdomen. Passage of more gas than usual.  Walking can help get rid of the air that was put into your GI tract during the procedure and reduce the bloating. If you had a lower endoscopy (such as a colonoscopy or flexible sigmoidoscopy) you may notice spotting of blood in your stool or on the toilet paper. If you underwent a bowel prep for your procedure, then you may not have a normal bowel movement for a few days.  DIET: see dilation diet- Drink plenty of fluids but you should avoid alcoholic beverages for 24 hours.  ACTIVITY: Your care partner should take you home directly after the procedure.  You should plan to take it easy, moving slowly for the rest of the day.  You can resume normal activity the day after the procedure however you should NOT DRIVE or use heavy machinery for 24 hours (because of the sedation medicines used during the test).    SYMPTOMS TO REPORT IMMEDIATELY: A gastroenterologist can be reached at any hour.  During normal business hours, 8:30 AM to 5:00 PM Monday through Friday, call [phone].  After hours and on weekends, please call the GI answering service at [phone] who will take a message and have the physician on call contact you.   Following upper endoscopy (EGD)  Vomiting of blood or coffee ground material  New chest pain or pain under the shoulder blades  Painful or persistently difficult swallowing  New  shortness of breath  Fever of 100F or higher  Black, tarry-looking stools  FOLLOW UP: If any biopsies were taken you will be contacted by phone or by letter within the next 1-3 weeks.  Call your gastroenterologist if you have not heard about the biopsies in 3 weeks.  Our staff will call the home number listed on your records the next business day following your procedure to check on you and address any questions or concerns that you may have at that time regarding the information given to you following your procedure. This is a courtesy call and so if there is no answer at the home number and we have not heard from you through the emergency physician on call, we will assume that you have returned to your regular daily activities without incident.  SIGNATURES/CONFIDENTIALITY: You and/or your care partner have signed paperwork which will be entered into your electronic medical record.  These signatures attest to the fact that that the information above on your After Visit Summary has been reviewed and is understood.  Full responsibility of the confidentiality of this discharge information lies with you and/or your care-partner.  GERD, dilation diet, stricture-handouts given  NPO for 2 hours then clear liquids until 8pm, then soft diet rest of day, resume prior diet tomorrow 10/10/12  GI follow-up as needed

## 2012-10-10 ENCOUNTER — Telehealth: Payer: Self-pay | Admitting: *Deleted

## 2012-10-10 NOTE — Telephone Encounter (Signed)
  Follow up Call-  Call back number 10/09/2012 05/10/2011 12/08/2010  Post procedure Call Back phone  # [phone]---ok to leave message [phone]  Permission to leave phone message Yes - -     Patient questions:  Do you have a fever, pain , or abdominal swelling? no Pain Score  0 *  Have you tolerated food without any problems? yes  Have you been able to return to your normal activities? yes  Do you have any questions about your discharge instructions: Diet   no Medications  no Follow up visit  no  Do you have questions or concerns about your Care? no  Actions: * If pain score is 4 or above: No action needed, pain <4.

## 2012-10-11 ENCOUNTER — Encounter: Payer: PRIVATE HEALTH INSURANCE | Admitting: Internal Medicine

## 2012-10-11 LAB — HELICOBACTER PYLORI SCREEN-BIOPSY: UREASE: NEGATIVE

## 2012-10-30 ENCOUNTER — Telehealth: Payer: Self-pay | Admitting: Family Medicine

## 2012-10-30 NOTE — Telephone Encounter (Signed)
Ok to refill 

## 2012-10-30 NOTE — Telephone Encounter (Signed)
PT called to request a refill of his cyclobenzaprine (FLEXERIL) 5 MG tablet. He would like this called into [facility] on [address]. Please assist.

## 2012-10-31 MED ORDER — CYCLOBENZAPRINE HCL 5 MG PO TABS: ORAL_TABLET | ORAL | Status: DC

## 2012-10-31 NOTE — Telephone Encounter (Signed)
Rx request to pharmacy/[facility]  

## 2013-05-28 ENCOUNTER — Ambulatory Visit: Payer: PRIVATE HEALTH INSURANCE | Admitting: Family Medicine

## 2013-05-28 ENCOUNTER — Encounter: Payer: Self-pay | Admitting: Family Medicine

## 2013-05-28 ENCOUNTER — Ambulatory Visit (INDEPENDENT_AMBULATORY_CARE_PROVIDER_SITE_OTHER): Payer: PRIVATE HEALTH INSURANCE | Admitting: Family Medicine

## 2013-05-28 VITALS — BP 110/74 | HR 79 | Temp 97.5°F | Wt 298.0 lb

## 2013-05-28 DIAGNOSIS — R519 Headache, unspecified: Secondary | ICD-10-CM

## 2013-05-28 DIAGNOSIS — R51 Headache: Secondary | ICD-10-CM

## 2013-05-28 MED ORDER — CYCLOBENZAPRINE HCL 10 MG PO TABS: 10.0000 mg | ORAL_TABLET | Freq: Three times a day (TID) | ORAL | Status: DC | PRN

## 2013-05-28 NOTE — Patient Instructions (Signed)

## 2013-05-28 NOTE — Progress Notes (Signed)
Pre visit review using our clinic review tool, if applicable. No additional management support is needed unless otherwise documented below in the visit note. 

## 2013-05-28 NOTE — Progress Notes (Signed)
   Subjective:    Patient ID: Keith Copeland, male    DOB: 01/22/1991, 23 y.o.   MRN: [id number]  HPI Patient seen for followup regarding headaches. He's been evaluated previously with suspected chronic tension-type headaches. He has very busy schedule with school 8 AM to 5 PM and work from 8:30 PM to 4:30 a.m. He's only getting about 1/2-2 hours sleep per night. Does finish school in almost about 6-7 weeks. His headaches are almost daily and usually start occipital spread forward. Occasional unilateral headache. Occasional nausea no vomiting. No visual changes. No focal weakness. No confusion. No history of seizures. Headaches are sometimes dull occasionally throbbing. No photophobia. Sleep is not relieving these. He has previously used cyclobenzaprine which did seem to help in prophylaxis.  No appetite or weight changes. No focal weakness. Patient relates that he was diagnosed last [facility] with pneumonia [facility]. Treated with Levaquin and  improved. No cough. No fever. No dyspnea.  Past Medical History  Diagnosis Date  . Dysphagia   . Allergy     poison ivy/oak  . Esophagitis   . Duodenitis   . Esophageal ring    Past Surgical History  Procedure Laterality Date  . Tonsillectomy      reports that he quit smoking about 2 years ago. His smoking use included Cigarettes. He has a .3 pack-year smoking history. He has never used smokeless tobacco. He reports that he drinks alcohol. He reports that he does not use illicit drugs. family history includes Breast cancer in his paternal grandmother; Diabetes in his paternal grandmother. There is no history of Colon cancer. No Known Allergies    Review of Systems  Constitutional: Negative for fever, chills, appetite change and unexpected weight change.  Eyes: Negative for visual disturbance.  Respiratory: Negative for shortness of breath.   Cardiovascular: Negative for chest pain.  Neurological: Positive for headaches. Negative for  dizziness, seizures, syncope, weakness, light-headedness and numbness.  Psychiatric/Behavioral: Negative for confusion.       Objective:   Physical Exam  Constitutional: He is oriented to person, place, and time. He appears well-developed and well-nourished.  HENT:  Right Ear: External ear normal.  Left Ear: External ear normal.  Mouth/Throat: Oropharynx is clear and moist.  Neck: Neck supple.  Cardiovascular: Normal rate.   Pulmonary/Chest: Effort normal and breath sounds normal. No respiratory distress. He has no wheezes. He has no rales.  Lymphadenopathy:    He has no cervical adenopathy.  Neurological: He is alert and oriented to person, place, and time. No cranial nerve deficit.  Psychiatric: He has a normal mood and affect. His behavior is normal.          Assessment & Plan:  Headaches. Suspect mixed type. He has features which are most likely chronic tension-type headache. He needs more consistent sleep but has very strict schedule as above. We did refill Flexeril which has helped in the past but cautioned about sedation but has had no effect on him regarding sedation previously. We discussed other conservative measures to reduce tension type headaches. Patient requesting completion of FMLA forms Over 25 minutes taken in evaluation and form completion.

## 2013-10-05 ENCOUNTER — Telehealth: Payer: Self-pay | Admitting: Family Medicine

## 2013-10-05 NOTE — Telephone Encounter (Signed)
Pt called would like to know when FMLA papers will be ready

## 2013-10-08 NOTE — Telephone Encounter (Signed)
Left message on patient VM that FMLA form is ready for pick up

## 2013-10-18 ENCOUNTER — Encounter: Payer: Self-pay | Admitting: Family Medicine

## 2013-10-18 ENCOUNTER — Ambulatory Visit (INDEPENDENT_AMBULATORY_CARE_PROVIDER_SITE_OTHER): Payer: PRIVATE HEALTH INSURANCE | Admitting: Family Medicine

## 2013-10-18 VITALS — BP 128/70 | HR 86 | Wt 300.0 lb

## 2013-10-18 DIAGNOSIS — B356 Tinea cruris: Secondary | ICD-10-CM

## 2013-10-18 MED ORDER — NYSTATIN 100000 UNIT/GM EX CREA: 1.0000 "application " | TOPICAL_CREAM | Freq: Two times a day (BID) | CUTANEOUS | Status: DC

## 2013-10-18 NOTE — Progress Notes (Signed)
   Subjective:    Patient ID: Keith Copeland, male    DOB: 15-Jun-1990, 23 y.o.   MRN: [id number]  Rash Pertinent negatives include no fever.   Pruritic rash involving groin area bilaterally. Duration about 3-4 weeks. He has some itching but also some irritation. His tried Desitin and coconut all without relief. He works about 10-12 hours per day. Frequently sweats with work. He has not tried antifungal creams. Exacerbated by heat  Past Medical History  Diagnosis Date  . Dysphagia   . Allergy     poison ivy/oak  . Esophagitis   . Duodenitis   . Esophageal ring    Past Surgical History  Procedure Laterality Date  . Tonsillectomy      reports that he quit smoking about 2 years ago. His smoking use included Cigarettes. He has a .3 pack-year smoking history. He has never used smokeless tobacco. He reports that he drinks alcohol. He reports that he does not use illicit drugs. family history includes Breast cancer in his paternal grandmother; Diabetes in his paternal grandmother. There is no history of Colon cancer. No Known Allergies    Review of Systems  Constitutional: Negative for fever and chills.  Skin: Positive for rash.       Objective:   Physical Exam  Constitutional: He appears well-developed and well-nourished.  Cardiovascular: Normal rate and regular rhythm.   Skin: Rash noted.  Erythematous macular rash involving his scrotum as well as groin bilaterally. A few satellite lesions.          Assessment & Plan:  Tinea cruris. Keep area dry as possible. Boxer shorts. Nystatin cream twice a day. Touch base in 2 weeks if no better. Patient also instructed to change out of wet clothing as soon as possible

## 2013-10-18 NOTE — Progress Notes (Signed)
Pre visit review using our clinic review tool, if applicable. No additional management support is needed unless otherwise documented below in the visit note. 

## 2013-10-18 NOTE — Patient Instructions (Signed)
Jock Itch Jock itch is a fungal infection of the skin in the groin area. It is sometimes called "ringworm" even though it is not caused by a worm. A fungus is a type of germ that thrives in dark, damp places.  CAUSES  This infection may spread from:  A fungus infection elsewhere on the body (such as athlete's foot).  Sharing towels or clothing. This infection is more common in:  Hot, humid climates.  People who wear tight-fitting clothing or wet bathing suits for long periods of time.  Athletes.  Overweight people.  People with diabetes. SYMPTOMS  Jock itch causes the following symptoms:  Red, pink or brown rash in the groin. Rash may spread to the thighs, anus, and buttocks.  Itching. DIAGNOSIS  Your caregiver may make the diagnosis by looking at the rash. Sometimes a skin scraping will be sent to test for fungus. Testing can be done either by looking under the microscope or by doing a culture (test to try to grow the fungus). A culture can take up to 2 weeks to come back. TREATMENT  Jock itch may be treated with:  Skin cream or ointment to kill fungus.  Medicine by mouth to kill fungus.  Skin cream or ointment to calm the itching.  Compresses or medicated powders to dry the infected skin. HOME CARE INSTRUCTIONS   Be sure to treat the rash completely. Follow your caregiver's instructions. It can take a couple of weeks to treat. If you do not treat the infection long enough, the rash can come back.  Wear loose-fitting clothing.  Men should wear cotton boxer shorts.  Women should wear cotton underwear.  Avoid hot baths.  Dry the groin area well after bathing. SEEK MEDICAL CARE IF:   Your rash is worse.  Your rash is spreading.  Your rash returns after treatment is finished.  Your rash is not gone in 4 weeks. Fungal infections are slow to respond to treatment. Some redness may remain for several weeks after the fungus is gone. SEEK IMMEDIATE MEDICAL CARE  IF:  The area becomes red, warm, tender, and swollen.  You have a fever. Document Released: 04/16/2002 Document Revised: 07/19/2011 Document Reviewed: 03/15/2008 ExitCare Patient Information 2014 [employer].  

## 2014-06-17 ENCOUNTER — Encounter: Payer: Self-pay | Admitting: Family Medicine

## 2014-06-17 ENCOUNTER — Ambulatory Visit (INDEPENDENT_AMBULATORY_CARE_PROVIDER_SITE_OTHER): Payer: PRIVATE HEALTH INSURANCE | Admitting: Family Medicine

## 2014-06-17 VITALS — BP 118/70 | HR 80 | Temp 98.1°F | Wt 307.0 lb

## 2014-06-17 DIAGNOSIS — G43009 Migraine without aura, not intractable, without status migrainosus: Secondary | ICD-10-CM

## 2014-06-17 MED ORDER — CYCLOBENZAPRINE HCL 10 MG PO TABS: 10.0000 mg | ORAL_TABLET | Freq: Three times a day (TID) | ORAL | Status: DC | PRN

## 2014-06-17 MED ORDER — SUMATRIPTAN SUCCINATE 100 MG PO TABS: 100.0000 mg | ORAL_TABLET | ORAL | Status: DC | PRN

## 2014-06-17 NOTE — Progress Notes (Signed)
Pre visit review using our clinic review tool, if applicable. No additional management support is needed unless otherwise documented below in the visit note. 

## 2014-06-17 NOTE — Patient Instructions (Signed)

## 2014-06-17 NOTE — Progress Notes (Signed)
   Subjective:    Patient ID: Keith Copeland, male    DOB: 05/23/1990, 24 y.o.   MRN: [id number]  HPI Patient here to discuss headaches. His headaches recently been occurring about once per week. No clear triggers. They tend to be right sided occipital to frontal. Sometimes throbbing but inconsistently. He has light sensitivity. Occasionally has nausea but no vomiting. Movement and activity seems to worsen. These are of moderate severity. He is not aware of any clear triggers. He generally gets 6-8 hours sleep per night. No consistent caffeine use. He's taken Excedrin Migraine and Flexeril which helped slightly. He's not had any history of seizure. No focal neurologic deficits. No visual changes. No consistent aura.  Past Medical History  Diagnosis Date  . Dysphagia   . Allergy     poison ivy/oak  . Esophagitis   . Duodenitis   . Esophageal ring    Past Surgical History  Procedure Laterality Date  . Tonsillectomy      reports that he quit smoking about 3 years ago. His smoking use included Cigarettes. He has a .3 pack-year smoking history. He has never used smokeless tobacco. He reports that he drinks alcohol. He reports that he does not use illicit drugs. family history includes Breast cancer in his paternal grandmother; Diabetes in his paternal grandmother. There is no history of Colon cancer. No Known Allergies    Review of Systems  Constitutional: Negative for fever, chills, appetite change, fatigue and unexpected weight change.  Respiratory: Negative for shortness of breath.   Cardiovascular: Negative for chest pain.  Neurological: Positive for headaches. Negative for dizziness, seizures, syncope, speech difficulty and weakness.  Hematological: Negative for adenopathy.  Psychiatric/Behavioral: Negative for confusion.       Objective:   Physical Exam  Constitutional: He is oriented to person, place, and time. He appears well-developed and well-nourished.  Eyes: Pupils are  equal, round, and reactive to light.  Fundi are benign  Neck: Neck supple. No thyromegaly present.  Cardiovascular: Normal rate and regular rhythm.   Pulmonary/Chest: Effort normal and breath sounds normal. No respiratory distress. He has no wheezes. He has no rales.  Musculoskeletal: He exhibits no edema.  Neurological: He is alert and oriented to person, place, and time. No cranial nerve deficit.          Assessment & Plan:  Intermittent headaches. Suspect acute migraine by history. Imitrex 100 mg when necessary and may repeat once in 2 hours but maximum of 2 in 24 hours. We discussed possible triggers. We also discussed possible prophylaxis but this point is not interested.

## 2014-06-17 NOTE — Addendum Note (Signed)
Addended by: Kristian CoveyBURCHETTE, Lainy Wrobleski W on: 06/17/2014 11:18 AM   Modules accepted: Orders

## 2014-08-30 ENCOUNTER — Ambulatory Visit (INDEPENDENT_AMBULATORY_CARE_PROVIDER_SITE_OTHER): Payer: PRIVATE HEALTH INSURANCE | Admitting: Family Medicine

## 2014-08-30 ENCOUNTER — Encounter: Payer: Self-pay | Admitting: Family Medicine

## 2014-08-30 VITALS — BP 128/78 | HR 88 | Temp 98.6°F | Wt 313.0 lb

## 2014-08-30 DIAGNOSIS — R519 Headache, unspecified: Secondary | ICD-10-CM

## 2014-08-30 DIAGNOSIS — R51 Headache: Secondary | ICD-10-CM | POA: Diagnosis not present

## 2014-08-30 MED ORDER — PREDNISONE 10 MG PO TABS: ORAL_TABLET | ORAL | Status: DC

## 2014-08-30 NOTE — Patient Instructions (Signed)
Avoid regular use of analgesics We will call you with MRI scan.

## 2014-08-30 NOTE — Progress Notes (Signed)
   Subjective:    Patient ID: Keith Copeland, male    DOB: 08/13/1990, 24 y.o.   MRN: [id number]  HPI  Patient seen with persistent somewhat atypical headaches. Suspected migraine headaches in the past. He is recently taking Imitrex without any improvement. He states for one and one half weeks he's had daily headache which radiates right occipital area to just behind his right eye. Symptoms are worse with turning the head rapidly. He described a sharp pain 8 out of 10 intensity at its worst. He's not had any nausea or vomiting. Mild photosensitivity. No visual changes. No focal weakness. He's tried Excedrin which helps temporarily. No confusion. No seizure history. No history of head trauma.  Past Medical History  Diagnosis Date  . Dysphagia   . Allergy     poison ivy/oak  . Esophagitis   . Duodenitis   . Esophageal ring    Past Surgical History  Procedure Laterality Date  . Tonsillectomy      reports that he quit smoking about 3 years ago. His smoking use included Cigarettes. He has a .3 pack-year smoking history. He has never used smokeless tobacco. He reports that he drinks alcohol. He reports that he does not use illicit drugs. family history includes Breast cancer in his paternal grandmother; Diabetes in his paternal grandmother. There is no history of Colon cancer. No Known Allergies   Review of Systems  Constitutional: Negative for appetite change and unexpected weight change.  Neurological: Positive for headaches. Negative for weakness.  Psychiatric/Behavioral: Negative for confusion.       Objective:   Physical Exam  Constitutional: He is oriented to person, place, and time. He appears well-developed and well-nourished.  Eyes: Pupils are equal, round, and reactive to light.  Fundi were not well seen  Neck: Neck supple.  Cardiovascular: Normal rate and regular rhythm.   Pulmonary/Chest: Effort normal and breath sounds normal. No respiratory distress. He has no wheezes. He  has no rales.  Neurological: He is alert and oriented to person, place, and time. No cranial nerve deficit. Coordination normal.  No focal weakness. Gait normal. No ataxia. Normal finger to nose testing.  Psychiatric: He has a normal mood and affect. His behavior is normal.          Assessment & Plan:  Atypical headaches. He describes unilateral throbbing headache but atypical features would include duration of one and one half weeks daily and exacerbation with movement. He has not had improvement with Imitrex. Obtain MRI brain to further assess. Brief taper prednisone-to cover for possible status migraine. Follow-up immediately for any new neurologic symptoms or any confusion or seizure.  Pt requesting FMLA papers be completed.

## 2014-08-30 NOTE — Progress Notes (Signed)
Pre visit review using our clinic review tool, if applicable. No additional management support is needed unless otherwise documented below in the visit note. 

## 2014-09-05 ENCOUNTER — Other Ambulatory Visit: Payer: PRIVATE HEALTH INSURANCE

## 2014-09-06 ENCOUNTER — Ambulatory Visit
Admission: RE | Admit: 2014-09-06 | Discharge: 2014-09-06 | Disposition: A | Discharge status: Home / Self Care | Payer: PRIVATE HEALTH INSURANCE | Source: Ambulatory Visit | Attending: Family Medicine | Admitting: Family Medicine

## 2014-09-06 DIAGNOSIS — R51 Headache: Principal | ICD-10-CM

## 2014-09-06 DIAGNOSIS — R519 Headache, unspecified: Secondary | ICD-10-CM

## 2014-09-09 ENCOUNTER — Telehealth: Payer: Self-pay | Admitting: Family Medicine

## 2014-09-09 NOTE — Telephone Encounter (Signed)
Patient would like to know if FMLA papers are complete and he would like to know results to his MRI?

## 2014-09-10 ENCOUNTER — Other Ambulatory Visit: Payer: Self-pay | Admitting: Family Medicine

## 2014-09-10 DIAGNOSIS — R51 Headache: Principal | ICD-10-CM

## 2014-09-10 DIAGNOSIS — R519 Headache, unspecified: Secondary | ICD-10-CM

## 2014-09-10 NOTE — Telephone Encounter (Signed)
Left message for patient to return call.

## 2014-09-10 NOTE — Telephone Encounter (Signed)
FMLA papers were completed last week and MRI has been answered.   We need to know if he is still having any headaches.

## 2014-09-10 NOTE — Telephone Encounter (Signed)
Pt informed. Pt states that he is still having headaches and he does want a referral to Neurology

## 2014-10-21 ENCOUNTER — Encounter: Payer: Self-pay | Admitting: Neurology

## 2014-10-21 ENCOUNTER — Ambulatory Visit (INDEPENDENT_AMBULATORY_CARE_PROVIDER_SITE_OTHER): Payer: PRIVATE HEALTH INSURANCE | Admitting: Neurology

## 2014-10-21 VITALS — BP 150/68 | HR 84 | Resp 20 | Ht 70.5 in | Wt 322.0 lb

## 2014-10-21 DIAGNOSIS — R030 Elevated blood-pressure reading, without diagnosis of hypertension: Secondary | ICD-10-CM

## 2014-10-21 DIAGNOSIS — G43719 Chronic migraine without aura, intractable, without status migrainosus: Secondary | ICD-10-CM | POA: Diagnosis not present

## 2014-10-21 DIAGNOSIS — IMO0001 Reserved for inherently not codable concepts without codable children: Secondary | ICD-10-CM

## 2014-10-21 MED ORDER — TOPIRAMATE 50 MG PO TABS: ORAL_TABLET | ORAL | Status: DC

## 2014-10-21 NOTE — Patient Instructions (Signed)
Migraine Recommendations: 1.  Start topiramate 50mg  tablet.  Take 1/2 tablet at bedtime for 7 days, then increase to 1 tablet at bedtime.  Call in 4 weeks with update and we can adjust dose if needed. 2.  Take sumatriptan 100mg  at earliest onset of headache.  May repeat dose once in 2 hours if needed.  Do not exceed two tablets in 24 hours. 3.  Limit use of pain relievers to no more than 2 days out of the week.  These medications include acetaminophen, Excedrin, ibuprofen, triptans and narcotics.  This will help reduce risk of rebound headaches.   4.  Be aware of common food triggers such as processed sweets, processed foods with nitrites (such as deli meat, hot dogs, sausages), foods with MSG, alcohol (such as wine), chocolate, certain cheeses, certain fruits (dried fruits, some citrus fruit), vinegar, diet soda. 4.  Avoid caffeine 5.  Routine exercise 6.  Proper sleep hygiene 7.  Stay adequately hydrated with water.  Eat healthy diet and not to skip meals 8.  Keep a headache diary. 9.  Maintain proper stress management. 10.  Follow up with PCP for blood pressure recheck.
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
## 2015-02-04 ENCOUNTER — Ambulatory Visit: Payer: PRIVATE HEALTH INSURANCE | Admitting: Neurology

## 2015-09-02 ENCOUNTER — Ambulatory Visit (INDEPENDENT_AMBULATORY_CARE_PROVIDER_SITE_OTHER)
Admission: RE | Admit: 2015-09-02 | Discharge: 2015-09-02 | Disposition: A | Discharge status: Home / Self Care | Payer: No Typology Code available for payment source | Source: Ambulatory Visit | Attending: Family Medicine | Admitting: Family Medicine

## 2015-09-02 ENCOUNTER — Encounter: Payer: Self-pay | Admitting: Family Medicine

## 2015-09-02 ENCOUNTER — Ambulatory Visit (INDEPENDENT_AMBULATORY_CARE_PROVIDER_SITE_OTHER): Payer: No Typology Code available for payment source | Admitting: Family Medicine

## 2015-09-02 VITALS — BP 128/90 | HR 111 | Temp 97.9°F | Ht 70.5 in | Wt 331.0 lb

## 2015-09-02 DIAGNOSIS — M25572 Pain in left ankle and joints of left foot: Secondary | ICD-10-CM

## 2015-09-02 NOTE — Progress Notes (Signed)
Pre visit review using our clinic review tool, if applicable. No additional management support is needed unless otherwise documented below in the visit note. 

## 2015-09-02 NOTE — Patient Instructions (Addendum)
Continue with elevation and ice and compression to left ankle. Set up CPE.
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
## 2016-09-01 ENCOUNTER — Encounter: Payer: Self-pay | Admitting: Physician Assistant

## 2016-09-01 ENCOUNTER — Ambulatory Visit (INDEPENDENT_AMBULATORY_CARE_PROVIDER_SITE_OTHER): Payer: No Typology Code available for payment source | Admitting: Physician Assistant

## 2016-09-01 VITALS — BP 108/80 | HR 82 | Ht 69.0 in | Wt 335.0 lb

## 2016-09-01 DIAGNOSIS — Z8719 Personal history of other diseases of the digestive system: Secondary | ICD-10-CM

## 2016-09-01 DIAGNOSIS — K200 Eosinophilic esophagitis: Secondary | ICD-10-CM | POA: Diagnosis not present

## 2016-09-01 DIAGNOSIS — R1310 Dysphagia, unspecified: Secondary | ICD-10-CM | POA: Diagnosis not present

## 2016-09-01 NOTE — Progress Notes (Signed)
Chief Complaint: Dysphagia, eosinophilic esophagitis, history of esophageal stricture  HPI:  Keith Copeland is a 26 year old Caucasian male with a past medical history of morbid obesity, eosinophilic esophagitis, esophageal ring and others listed below, who typically follows with Dr. Marina Goodell and returns to clinic today for a complaint of dysphagia.   Patient's last endoscopy with Elease Hashimoto dilation was performed on 10/09/12. This showed esophageal rings which were dilated with a 52 French Maloney dilator, duodenitis, GERD/EOE. Patient was prescribed Omeprazole 40 mg daily.    Today, the patient presents to clinic and tells me that he has had return of his symptoms. They've been increasing since October/November of last year. He tells me that around then he started having to "drink more while I eat" in order to get his food down, now he feels as though things are getting stuck in his throat. He also tells me that he is scheduled to have bariatric surgery in July/August of this year and needs an endoscopy for this reason as well. He is trying to "hit two birds with one stone". Patient denies anything new about his symptoms and has continued to take his Omeprazole 40 mg daily.   Patient denies fever, chills, blood in the stool, melena, abdominal pain, anorexia, change in bowel habits or symptoms that awaken him at night.  Past Medical History:  Diagnosis Date  . Allergy    poison ivy/oak  . Duodenitis   . Dysphagia   . Esophageal ring   . Esophagitis   . Headache     Past Surgical History:  Procedure Laterality Date  . TONSILLECTOMY      Current Outpatient Prescriptions  Medication Sig Dispense Refill  . levothyroxine (SYNTHROID, LEVOTHROID) 75 MCG tablet Take 75 mcg by mouth daily before breakfast.    . Multiple Vitamin (MULTIVITAMIN) tablet Take 1 tablet by mouth daily.    Marland Kitchen omeprazole (PRILOSEC) 40 MG capsule Take 1 capsule (40 mg total) by mouth daily. 30 capsule 11  . Vitamin D,  Ergocalciferol, (DRISDOL) 50000 units CAPS capsule Take 50,000 Units by mouth every 7 (seven) days.     No current facility-administered medications for this visit.     Allergies as of 09/01/2016  . (No Known Allergies)    Family History  Problem Relation Age of Onset  . Breast cancer Paternal Grandmother   . Diabetes Paternal Grandmother   . Thyroid disease Brother     hyper/ hypothyroidism   . Colon cancer Neg Hx     Social History   Social History  . Marital status: Single    Spouse name: N/A  . Number of children: 0  . Years of education: N/A   Occupational History  . [employer]   Social History Main Topics  . Smoking status: Former Smoker    Packs/day: 0.10    Years: 3.00    Types: Cigarettes    Quit date: 02/05/2011  . Smokeless tobacco: Never Used  . Alcohol use 0.0 oz/week     Comment: "maybe 1 beer on weekends"  . Drug use: No  . Sexual activity: Yes    Partners: Male   Other Topics Concern  . Not on file   Social History Narrative  . No narrative on file    Review of Systems:    Constitutional: No weight loss, fever or chills Skin: No rash  Cardiovascular: No chest pain Respiratory: No SOB Gastrointestinal: See HPI and otherwise negative Genitourinary: No dysuria  Neurological: No headache Musculoskeletal:  No new muscle or joint pain Hematologic: No bleeding  Psychiatric: No history of depression or anxiety   Physical Exam:  Vital signs: BP 108/80   Pulse 82   Ht  (1.753 m)   Wt (!) 335 lb (152 kg)   BMI 49.47 kg/m    Constitutional:   Pleasant morbidly obese Caucasian male appears to be in NAD, Well developed, Well nourished, alert and cooperative Head:  Normocephalic and atraumatic. Eyes:   PEERL, EOMI. No icterus. Conjunctiva pink. Ears:  Normal auditory acuity. Neck:  Supple Throat: Oral cavity and pharynx without inflammation, swelling or lesion.  Respiratory: Respirations even and unlabored. Lungs clear to  auscultation bilaterally.   No wheezes, crackles, or rhonchi.  Cardiovascular: Normal S1, S2. No MRG. Regular rate and rhythm. No peripheral edema, cyanosis or pallor.  Gastrointestinal:  Soft, nondistended, nontender. No rebound or guarding. Normal bowel sounds. No appreciable masses or hepatomegaly. Rectal:  Not performed.  Msk:  Symmetrical without gross deformities. Without edema, no deformity or joint abnormality.  Neurologic:  Alert and  oriented x4;  grossly normal neurologically.  Skin:   Dry and intact without significant lesions or rashes. Psychiatric:  Demonstrates good judgement and reason without abnormal affect or behaviors.  No recent labs or imaging.  Assessment: 1. Dysphagia: Increasing since October/November of last year, history of multiple dilations due to EOE/GERD and esophageal rings in the past, last 10/09/12, likely this is the same 2. History of esophageal stricture/ring 3. Eosinophilic esophagitis/GERD  Plan: 1. Scheduled patient for an EGD with dilation in the [facility] with Dr. Marina Goodell. Discussed risks, benefits, limitations and alternatives the patient agrees to proceed. 2. Reviewed anti-dysphagia measures including taking small bites, drinking sips of water in between bites, avoiding distraction while eating and the chin tuck technique. 4. Patient to continue his Omeprazole 40 mg daily, 30-60 minutes before eating. 4. Did discuss possible treatment with swallowed fluticasone in the future for his EOE? Will leave this to Dr. Marina Goodell if he feels like patient is a candidate for this therapy after time of endoscopy. 5. Patient to follow in clinic per Dr. Lamar Sprinkles recommendations after time of procedure  Hyacinth Meeker, PA-C [facility] 09/01/2016, 11:12 AM  Cc: Kristian Covey, MD

## 2016-09-01 NOTE — Patient Instructions (Signed)
Continue Omeprazole 40 mg daily.  You have been scheduled for an endoscopy. Please follow written instructions given to you at your visit today. If you use inhalers (even only as needed), please bring them with you on the day of your procedure. Your physician has requested that you go to www.startemmi.com and enter the access code given to you at your visit today. This web site gives a general overview about your procedure. However, you should still follow specific instructions given to you by our office regarding your preparation for the procedure.      

## 2016-09-02 NOTE — Progress Notes (Signed)
Reviewed. The data on fluticasone is not compelling. Agree with regular PPI for esophageal eosinophilia and esophageal dilation as needed

## 2016-10-06 ENCOUNTER — Encounter: Payer: Self-pay | Admitting: Internal Medicine

## 2016-10-20 ENCOUNTER — Ambulatory Visit (AMBULATORY_SURGERY_CENTER): Payer: No Typology Code available for payment source | Admitting: Internal Medicine

## 2016-10-20 ENCOUNTER — Encounter: Payer: Self-pay | Admitting: Internal Medicine

## 2016-10-20 VITALS — BP 108/67 | HR 65 | Temp 98.2°F | Resp 12 | Ht 69.0 in | Wt 335.0 lb

## 2016-10-20 DIAGNOSIS — R1310 Dysphagia, unspecified: Secondary | ICD-10-CM

## 2016-10-20 DIAGNOSIS — K2990 Gastroduodenitis, unspecified, without bleeding: Secondary | ICD-10-CM

## 2016-10-20 DIAGNOSIS — K222 Esophageal obstruction: Secondary | ICD-10-CM | POA: Diagnosis not present

## 2016-10-20 MED ORDER — SODIUM CHLORIDE 0.9 % IV SOLN: 500.0000 mL | INTRAVENOUS | Status: DC

## 2016-10-20 NOTE — Progress Notes (Signed)
Report to PACU, RN, vss, BBS= Clear.  

## 2016-10-20 NOTE — Patient Instructions (Signed)
Dicharge instructions given. Handout on a dilatation diet. Resume previous medications. YOU HAD AN ENDOSCOPIC PROCEDURE TODAY AT THE [facility]:   Refer to the procedure report that was given to you for any specific questions about what was found during the examination.  If the procedure report does not answer your questions, please call your gastroenterologist to clarify.  If you requested that your care partner not be given the details of your procedure findings, then the procedure report has been included in a sealed envelope for you to review at your convenience later.  YOU SHOULD EXPECT: Some feelings of bloating in the abdomen. Passage of more gas than usual.  Walking can help get rid of the air that was put into your GI tract during the procedure and reduce the bloating. If you had a lower endoscopy (such as a colonoscopy or flexible sigmoidoscopy) you may notice spotting of blood in your stool or on the toilet paper. If you underwent a bowel prep for your procedure, you may not have a normal bowel movement for a few days.  Please Note:  You might notice some irritation and congestion in your nose or some drainage.  This is from the oxygen used during your procedure.  There is no need for concern and it should clear up in a day or so.  SYMPTOMS TO REPORT IMMEDIATELY:   Following upper endoscopy (EGD)  Vomiting of blood or coffee ground material  New chest pain or pain under the shoulder blades  Painful or persistently difficult swallowing  New shortness of breath  Fever of 100F or higher  Black, tarry-looking stools  For urgent or emergent issues, a gastroenterologist can be reached at any hour by calling [phone].   DIET:  We do recommend a small meal at first, but then you may proceed to your regular diet.  Drink plenty of fluids but you should avoid alcoholic beverages for 24 hours.  ACTIVITY:  You should plan to take it easy for the rest of today and you  should NOT DRIVE or use heavy machinery until tomorrow (because of the sedation medicines used during the test).    FOLLOW UP: Our staff will call the number listed on your records the next business day following your procedure to check on you and address any questions or concerns that you may have regarding the information given to you following your procedure. If we do not reach you, we will leave a message.  However, if you are feeling well and you are not experiencing any problems, there is no need to return our call.  We will assume that you have returned to your regular daily activities without incident.  If any biopsies were taken you will be contacted by phone or by letter within the next 1-3 weeks.  Please call us at [phone] if you have not heard about the biopsies in 3 weeks.    SIGNATURES/CONFIDENTIALITY: You and/or your care partner have signed paperwork which will be entered into your electronic medical record.  These signatures attest to the fact that that the information above on your After Visit Summary has been reviewed and is understood.  Full responsibility of the confidentiality of this discharge information lies with you and/or your care-partner.

## 2016-10-20 NOTE — Op Note (Signed)
[facility] Patient Name: Keith Copeland Procedure Date: 10/20/2016 10:10 AM MRN: [id number] Endoscopist: Wilhemina Bonito. Marina Goodell , MD Age: 26 Referring MD:  Date of Birth: 05-29-1990 Gender: Male Account #: [id number] Procedure:                Upper GI endoscopy, with biopsies and Maloney                            dilation of the esophagus 49F Indications:              Dysphagia. GERD with EOE-type esophagus. Previous                            dilations. Also anticipating bariatric surgery                            (preop evaluation) Medicines:                Monitored Anesthesia Care Procedure:                Pre-Anesthesia Assessment:                           - Prior to the procedure, a History and Physical                            was performed, and patient medications and                            allergies were reviewed. The patient's tolerance of                            previous anesthesia was also reviewed. The risks                            and benefits of the procedure and the sedation                            options and risks were discussed with the patient.                            All questions were answered, and informed consent                            was obtained. Prior Anticoagulants: The patient has                            taken no previous anticoagulant or antiplatelet                            agents. ASA Grade Assessment: II - A patient with                            mild systemic disease. After reviewing the risks  and benefits, the patient was deemed in                            satisfactory condition to undergo the procedure.                           After obtaining informed consent, the endoscope was                            passed under direct vision. Throughout the                            procedure, the patient's blood pressure, pulse, and                            oxygen saturations were monitored  continuously. The                            Model GIF-HQ190 [id number]) scope was introduced                            through the mouth, and advanced to the second part                            of duodenum. The upper GI endoscopy was                            accomplished without difficulty. The patient                            tolerated the procedure well. Scope In: Scope Out: Findings:                 Multiple moderate benign-appearing, intrinsic rings                            were found. The narrowest stenosis measured 1.4 cm                            (inner diameter). There is also more classic peptic                            stricture at the gastroesophageal junction. The                            scope was withdrawn, post endoscopic evaluation.                            Dilation was performed with a Maloney dilator with                            mild resistance at 52 Fr. The dilation site was  examined following endoscope reinsertion and showed                            moderate improvement in luminal narrowing. There                            was superficial trauma secondary to ring disruption.                           The stomach revealed several superficial erosions                            along the posterior portion of the gastric body.                           The stomach was otherwise normal. Biopsies were                            taken with a cold forceps for Helicobacter pylori                            testing using CLOtest.                           The examined duodenum revealed erythema without                            mucosal disruption.                           The cardia and gastric fundus were normal on                            retroflexion. Complications:            No immediate complications. Estimated Blood Loss:     Estimated blood loss: none. Impression:               1. Esophageal stricture and multiple  esophageal                            rings status post Maloney dilation to 52 French                           2. GERD                           3. Mild gastroduodenitis. Status post CLO biopsy. Recommendation:           1. Post esophageal dilation diet                           2. Continue omeprazole 40 mg daily                           3. Follow-up CLO testing. Treat if positive  4. Routine GI follow-up one year. Sooner if needed. Wilhemina BonitoJohn N. Marina GoodellPerry, MD 10/20/2016 10:41:49 AM This report has been signed electronically.

## 2016-10-20 NOTE — Progress Notes (Signed)
Called to room to assist during endoscopic procedure.  Patient ID and intended procedure confirmed with present staff. Received instructions for my participation in the procedure from the performing physician.  

## 2016-10-21 ENCOUNTER — Telehealth: Payer: Self-pay

## 2016-10-21 LAB — HELICOBACTER PYLORI SCREEN-BIOPSY: UREASE: NEGATIVE

## 2016-10-21 NOTE — Telephone Encounter (Signed)
Left message on answering machine. 

## 2016-10-21 NOTE — Telephone Encounter (Signed)
  Follow up Call-  Call back number 10/20/2016  Post procedure Call Back phone  # #[phone] cell  Permission to leave phone message Yes  Some recent data might be hidden     Patient questions:  Do you have a fever, pain , or abdominal swelling? No. Pain Score  0 *  Have you tolerated food without any problems? Yes.    Have you been able to return to your normal activities? Yes.    Do you have any questions about your discharge instructions: Diet   No. Medications  No. Follow up visit  No.  Do you have questions or concerns about your Care? No.  Actions: * If pain score is 4 or above: No action needed, pain <4.

## 2017-06-27 ENCOUNTER — Emergency Department (HOSPITAL_BASED_OUTPATIENT_CLINIC_OR_DEPARTMENT_OTHER)

## 2017-06-27 ENCOUNTER — Encounter (HOSPITAL_BASED_OUTPATIENT_CLINIC_OR_DEPARTMENT_OTHER): Payer: Self-pay | Admitting: Emergency Medicine

## 2017-06-27 ENCOUNTER — Emergency Department (HOSPITAL_BASED_OUTPATIENT_CLINIC_OR_DEPARTMENT_OTHER)
Admission: EM | Admit: 2017-06-27 | Discharge: 2017-06-27 | Disposition: A | Discharge status: Home / Self Care | Attending: Emergency Medicine | Admitting: Emergency Medicine

## 2017-06-27 ENCOUNTER — Other Ambulatory Visit: Payer: Self-pay

## 2017-06-27 DIAGNOSIS — E039 Hypothyroidism, unspecified: Secondary | ICD-10-CM | POA: Insufficient documentation

## 2017-06-27 DIAGNOSIS — Y929 Unspecified place or not applicable: Secondary | ICD-10-CM | POA: Diagnosis not present

## 2017-06-27 DIAGNOSIS — S59912A Unspecified injury of left forearm, initial encounter: Secondary | ICD-10-CM | POA: Diagnosis present

## 2017-06-27 DIAGNOSIS — S52202A Unspecified fracture of shaft of left ulna, initial encounter for closed fracture: Secondary | ICD-10-CM

## 2017-06-27 DIAGNOSIS — Y990 Civilian activity done for income or pay: Secondary | ICD-10-CM | POA: Insufficient documentation

## 2017-06-27 DIAGNOSIS — Y939 Activity, unspecified: Secondary | ICD-10-CM | POA: Diagnosis not present

## 2017-06-27 DIAGNOSIS — W319XXA Contact with unspecified machinery, initial encounter: Secondary | ICD-10-CM | POA: Insufficient documentation

## 2017-06-27 DIAGNOSIS — Z87891 Personal history of nicotine dependence: Secondary | ICD-10-CM | POA: Diagnosis not present

## 2017-06-27 LAB — BASIC METABOLIC PANEL
ANION GAP: 11 (ref 5–15)
BUN: 10 mg/dL (ref 6–20)
CO2: 23 mmol/L (ref 22–32)
Calcium: 8.6 mg/dL — ABNORMAL LOW (ref 8.9–10.3)
Chloride: 105 mmol/L (ref 101–111)
Creatinine, Ser: 0.84 mg/dL (ref 0.61–1.24)
GFR calc Af Amer: >60 mL/min (ref >60)
GLUCOSE: 131 mg/dL — AB (ref 65–99)
POTASSIUM: 3.1 mmol/L — AB (ref 3.5–5.1)
Sodium: 139 mmol/L (ref 135–145)

## 2017-06-27 LAB — CBC WITH DIFFERENTIAL/PLATELET
Basophils Absolute: 0 10*3/uL (ref 0.0–0.1)
Basophils Relative: 0 %
EOS PCT: 1 %
Eosinophils Absolute: 0.2 10*3/uL (ref 0.0–0.7)
HCT: 41.9 % (ref 39.0–52.0)
Hemoglobin: 13.9 g/dL (ref 13.0–17.0)
LYMPHS PCT: 20 %
Lymphs Abs: 2.4 10*3/uL (ref 0.7–4.0)
MCH: 29.9 pg (ref 26.0–34.0)
MCHC: 33.2 g/dL (ref 30.0–36.0)
MCV: 90.1 fL (ref 78.0–100.0)
Monocytes Absolute: 0.8 10*3/uL (ref 0.1–1.0)
Monocytes Relative: 7 %
NEUTROS ABS: 8.5 10*3/uL — AB (ref 1.7–7.7)
Neutrophils Relative %: 72 %
PLATELETS: 207 10*3/uL (ref 150–400)
RBC: 4.65 MIL/uL (ref 4.22–5.81)
RDW: 14.1 % (ref 11.5–15.5)
WBC: 11.8 10*3/uL — ABNORMAL HIGH (ref 4.0–10.5)

## 2017-06-27 MED ORDER — IOPAMIDOL (ISOVUE-370) INJECTION 76%
100.0000 mL | Freq: Once | INTRAVENOUS | Status: AC | PRN
  Administered 2017-06-27: 100 mL via INTRAVENOUS

## 2017-06-27 MED ORDER — OXYCODONE-ACETAMINOPHEN 5-325 MG PO TABS
1.0000 | ORAL_TABLET | Freq: Once | ORAL | Status: AC
  Administered 2017-06-27: 1 via ORAL
  Filled 2017-06-27: qty 1

## 2017-06-27 MED ORDER — HYDROMORPHONE HCL 1 MG/ML IJ SOLN
1.0000 mg | Freq: Once | INTRAMUSCULAR | Status: AC
  Administered 2017-06-27: 1 mg via INTRAVENOUS
  Filled 2017-06-27: qty 1

## 2017-06-27 MED ORDER — HYDROMORPHONE HCL 1 MG/ML IJ SOLN: 1.0000 mg | Freq: Once | INTRAMUSCULAR | Status: DC

## 2017-06-27 MED ORDER — OXYCODONE-ACETAMINOPHEN 5-325 MG PO TABS: 1.0000 | ORAL_TABLET | ORAL | 0 refills | Status: AC | PRN

## 2017-06-27 MED ORDER — POTASSIUM CHLORIDE CRYS ER 20 MEQ PO TBCR
40.0000 meq | EXTENDED_RELEASE_TABLET | Freq: Once | ORAL | Status: AC
  Administered 2017-06-27: 40 meq via ORAL
  Filled 2017-06-27: qty 2

## 2017-06-27 MED ORDER — ONDANSETRON HCL 4 MG/2ML IJ SOLN
4.0000 mg | Freq: Once | INTRAMUSCULAR | Status: AC
  Administered 2017-06-27: 4 mg via INTRAVENOUS
  Filled 2017-06-27: qty 2

## 2017-06-27 NOTE — Discharge Instructions (Addendum)
Your imaging today showed evidence of a forearm fracture in your ulna.  Your CT imaging did not show evidence of a vascular injury.  As your exam was reassuring, we feel you are safe for discharge home.  Please keep the arm elevated in its splint until you follow-up with the hand surgery team.  Please call them tomorrow to schedule the appointment.  If any symptoms change or worsen acutely, please return to the nearest emergency department.

## 2017-06-27 NOTE — ED Provider Notes (Signed)
[facility] EMERGENCY DEPARTMENT Provider Note   CSN: [id number] Arrival date & time: 06/27/17  1548     History   Chief Complaint Chief Complaint  Patient presents with  . Arm Injury    HPI Jene EverySean Zapata is a 27 y.o. male.  The history is provided by the patient and medical records. No language interpreter was used.  Arm Injury   This is a new problem. The current episode started less than 1 hour ago. The problem occurs constantly. The problem has not changed since onset.The pain is present in the left arm. The quality of the pain is described as aching. The pain is at a severity of 9/10. The pain is severe. Associated symptoms include tingling. Pertinent negatives include no numbness and no stiffness. The symptoms are aggravated by contact. He has tried nothing for the symptoms. The treatment provided no relief. There has been a history of trauma.    Past Medical History:  Diagnosis Date  . Allergy    poison ivy/oak  . Duodenitis   . Dysphagia   . Esophageal ring   . Esophagitis   . GERD (gastroesophageal reflux disease)   . Headache   . Thyroid disease    hypothyroidism    Patient Active Problem List   Diagnosis Date Noted  . Intractable chronic migraine without aura and without status migrainosus 10/21/2014  . Severe obesity (BMI >= 40) (HCC) 10/21/2014  . CERUMEN IMPACTION 04/17/2010  . CIGARETTE SMOKER 04/17/2010    Past Surgical History:  Procedure Laterality Date  . LAPAROSCOPIC GASTRIC SLEEVE RESECTION    . TONSILLECTOMY    . UPPER GASTROINTESTINAL ENDOSCOPY     with dilatation  . WISDOM TOOTH EXTRACTION         Home Medications    Prior to Admission medications   Medication Sig Start Date End Date Taking? Authorizing Provider  levothyroxine (SYNTHROID, LEVOTHROID) 75 MCG tablet Take 75 mcg by mouth daily before breakfast.    [provider]  Multiple Vitamin (MULTIVITAMIN) tablet Take 1 tablet by mouth daily.    [provider]  omeprazole (PRILOSEC) 40 MG capsule Take 1 capsule (40 mg total) by mouth daily. 10/09/12   Hilarie FredricksonPerry, John N, MD  Vitamin D, Ergocalciferol, (DRISDOL) 50000 units CAPS capsule Take 50,000 Units by mouth every 7 (seven) days.    [provider]    Family History Family History  Problem Relation Age of Onset  . Breast cancer Paternal Grandmother   . Diabetes Paternal Grandmother   . Thyroid disease Brother        hyper/ hypothyroidism   . Colon cancer Neg Hx   . Esophageal cancer Neg Hx   . Pancreatic cancer Neg Hx   . Prostate cancer Neg Hx   . Rectal cancer Neg Hx   . Stomach cancer Neg Hx     Social History Social History   Tobacco Use  . Smoking status: Former Smoker    Packs/day: 0.10    Years: 3.00    Pack years: 0.30    Types: Cigarettes    Last attempt to quit: 02/05/2011    Years since quitting: 6.3  . Smokeless tobacco: Never Used  Substance Use Topics  . Alcohol use: Yes    Alcohol/week: 0.0 oz    Comment: "maybe 1 beer on weekends"  . Drug use: No     Allergies   Patient has no known allergies.   Review of Systems Review of Systems  Constitutional:  Negative for chills, diaphoresis, fatigue and fever.  HENT: Negative for congestion.   Eyes: Negative for visual disturbance.  Respiratory: Negative for chest tightness and shortness of breath.   Cardiovascular: Negative for chest pain.  Gastrointestinal: Negative for abdominal pain, constipation, diarrhea, nausea and vomiting.  Musculoskeletal: Negative for back pain and stiffness.  Skin: Positive for color change. Negative for rash and wound.  Neurological: Positive for tingling. Negative for light-headedness and numbness.  Psychiatric/Behavioral: Negative for confusion.  All other systems reviewed and are negative.    Physical Exam Updated Vital Signs BP (!) 173/113 (BP Location: Right Arm)   Pulse 75   Temp 97.8 F (36.6 C) (Oral)   Resp 20   Ht 5\' 9"  (1.753 m)   Wt 93.4  kg (206 lb)   SpO2 100%   BMI 30.42 kg/m   Physical Exam  Constitutional: He is oriented to person, place, and time. He appears well-developed and well-nourished. No distress.  HENT:  Head: Normocephalic.  Nose: Nose normal.  Mouth/Throat: Oropharynx is clear and moist. No oropharyngeal exudate.  Eyes: Conjunctivae and EOM are normal. Pupils are equal, round, and reactive to light.  Neck: Normal range of motion.  Cardiovascular: Normal rate.  No murmur heard. Pulmonary/Chest: Effort normal. No respiratory distress. He has no wheezes. He exhibits no tenderness.  Abdominal: There is no tenderness.  Musculoskeletal: He exhibits tenderness and deformity.       Left forearm: He exhibits tenderness, swelling and deformity. He exhibits no laceration.  Neurological: He is alert and oriented to person, place, and time. No cranial nerve deficit or sensory deficit. He exhibits normal muscle tone. Coordination normal.  Skin: Skin is warm. Capillary refill takes less than 2 seconds. He is not diaphoretic. No erythema. There is pallor.  Psychiatric: He has a normal mood and affect.  Nursing note and vitals reviewed.        ED Treatments / Results  Labs (all labs ordered are listed, but only abnormal results are displayed) Labs Reviewed  CBC WITH DIFFERENTIAL/PLATELET - Abnormal; Notable for the following components:      Result Value   WBC 11.8 (*)    Neutro Abs 8.5 (*)    All other components within normal limits  BASIC METABOLIC PANEL - Abnormal; Notable for the following components:   Potassium 3.1 (*)    Glucose, Bld 131 (*)    Calcium 8.6 (*)    All other components within normal limits    EKG  EKG Interpretation None       Radiology Dg Elbow Complete Left  Result Date: 06/27/2017 CLINICAL DATA:  Left arm injury.  Went through a conveyor belt. EXAM: LEFT HAND - COMPLETE 3+ VIEW; LEFT FOREARM - 2 VIEW; LEFT ELBOW - COMPLETE 3+ VIEW COMPARISON:  None. FINDINGS: Left hand:  No acute fracture or dislocation. Joint spaces are preserved. Bone mineralization is normal. Soft tissues are unremarkable. Left forearm: There is a transverse fracture through the distal ulnar diaphysis, with 4 mm medial displacement and slight volar angulation. Bone mineralization is normal. Diffuse soft tissue swelling. No radiopaque foreign body identified. Left elbow: No acute fracture or dislocation. No joint effusion. Bone mineralization is normal. Soft tissues are unremarkable. IMPRESSION: 1. Minimally displaced transverse fracture through the distal ulnar diaphysis. 2. No acute osseous abnormality of the hand or elbow. No radiopaque foreign body identified. Electronically Signed   By: Obie Dredge M.D.   On: 06/27/2017 17:04   Dg Forearm Left  Result Date:  06/27/2017 CLINICAL DATA:  Left arm injury.  Went through a conveyor belt. EXAM: LEFT HAND - COMPLETE 3+ VIEW; LEFT FOREARM - 2 VIEW; LEFT ELBOW - COMPLETE 3+ VIEW COMPARISON:  None. FINDINGS: Left hand: No acute fracture or dislocation. Joint spaces are preserved. Bone mineralization is normal. Soft tissues are unremarkable. Left forearm: There is a transverse fracture through the distal ulnar diaphysis, with 4 mm medial displacement and slight volar angulation. Bone mineralization is normal. Diffuse soft tissue swelling. No radiopaque foreign body identified. Left elbow: No acute fracture or dislocation. No joint effusion. Bone mineralization is normal. Soft tissues are unremarkable. IMPRESSION: 1. Minimally displaced transverse fracture through the distal ulnar diaphysis. 2. No acute osseous abnormality of the hand or elbow. No radiopaque foreign body identified. Electronically Signed   By: Obie Dredge M.D.   On: 06/27/2017 17:04   Ct Angio Up Extrem Left W &/or Wo Contast  Result Date: 06/27/2017 CLINICAL DATA:  Left upper extremity injury tonight. Ulnar shaft fracture. Evaluate for possible arterial injury. EXAM: CT ANGIOGRAPHY UPPER  LEFT EXTREMITY TECHNIQUE: Standard left upper extremity CT angiogram was performed with coronal and sagittal reformatted images. CONTRAST:  ISOVUE-370 IOPAMIDOL (ISOVUE-370) INJECTION 76% COMPARISON:  None. FINDINGS: Exam is somewhat limited by motion artifact. The brachial artery, radial artery, ulnar artery and anterior interosseous artery are patent. No evidence of occlusion, dissection or transsection. No extravasation of contrast. No obvious hematoma. Distal 1/3 ulnar shaft fracture is demonstrated with 1 cortex width of dorsal angulation and 1 cortex width of radial displacement. The elbow and wrist joints are maintained. Review of the MIP images confirms the above findings. IMPRESSION: 1. No evidence of acute arterial injury involving the left upper extremity. 2. Minimally displaced distal ulnar shaft fracture. Electronically Signed   By: Rudie Meyer M.D.   On: 06/27/2017 20:09   Dg Hand Complete Left  Result Date: 06/27/2017 CLINICAL DATA:  Left arm injury.  Went through a conveyor belt. EXAM: LEFT HAND - COMPLETE 3+ VIEW; LEFT FOREARM - 2 VIEW; LEFT ELBOW - COMPLETE 3+ VIEW COMPARISON:  None. FINDINGS: Left hand: No acute fracture or dislocation. Joint spaces are preserved. Bone mineralization is normal. Soft tissues are unremarkable. Left forearm: There is a transverse fracture through the distal ulnar diaphysis, with 4 mm medial displacement and slight volar angulation. Bone mineralization is normal. Diffuse soft tissue swelling. No radiopaque foreign body identified. Left elbow: No acute fracture or dislocation. No joint effusion. Bone mineralization is normal. Soft tissues are unremarkable. IMPRESSION: 1. Minimally displaced transverse fracture through the distal ulnar diaphysis. 2. No acute osseous abnormality of the hand or elbow. No radiopaque foreign body identified. Electronically Signed   By: Obie Dredge M.D.   On: 06/27/2017 17:04    Procedures Procedures (including critical  care time)  Medications Ordered in ED Medications  HYDROmorphone (DILAUDID) injection 1 mg (1 mg Intravenous Given 06/27/17 1624)  HYDROmorphone (DILAUDID) injection 1 mg (1 mg Intravenous Given 06/27/17 1720)  iopamidol (ISOVUE-370) 76 % injection 100 mL (100 mLs Intravenous Contrast Given 06/27/17 1809)  HYDROmorphone (DILAUDID) injection 1 mg (1 mg Intravenous Given 06/27/17 1922)  ondansetron (ZOFRAN) injection 4 mg (4 mg Intravenous Given 06/27/17 2045)  oxyCODONE-acetaminophen (PERCOCET/ROXICET) 5-325 MG per tablet 1 tablet (1 tablet Oral Given 06/27/17 2045)  potassium chloride SA (K-DUR,KLOR-CON) CR tablet 40 mEq (40 mEq Oral Given 06/27/17 2045)     Initial Impression / Assessment and Plan / ED Course  I have reviewed the triage vital  signs and the nursing notes.  Pertinent labs & imaging results that were available during my care of the patient were reviewed by me and considered in my medical decision making (see chart for details).     Carmino Ocain is a 27 y.o. male with no significant past medical history who presents with arm injury.  Patient is right-handed and reports that approximately 1 hour ago, patient crushed his left arm in a conveyor belt at his job.  He reports that his left arm was caught under the belt and a roller.  He reports immediate onset of pain and deformity.  No lacerations.  Patient reports some tingling in his hand but otherwise no overt numbness.  He still able to grip.  He called EMS and was brought in for evaluation.  Patient denied any head injury or other locations of pain.  He denied any preceding symptoms.  On exam, patient has strong radial pulse.  Normal capillary refill.  Patient's hand does appear slightly pale as seen in clinical photo above.  Patient had tenderness in the mid left forearm.  No significant tenderness in the hand or wrist.  The upper arm.  Mild tenderness in the elbow especially with manipulation.  Patient's lungs otherwise clear neck  nontender and exam unremarkable.  Patient also had normal sensation in the hands and normal grip strength.  Normal capillary refill.  X-rays were obtained showing evidence of ulnar fracture.  On patient assessment, patient's hand appeared more pale.  Decision made to obtain CTA to look for vascular dissection or injury.  CT scan showed no evidence of vascular injury and confirmed only the ulnar fracture.  No other evidence of fractures or dislocation on CT imaging.  On reassessment, no evidence of compartment syndrome.  Dr. Janee Morn with orthopedic hand surgery was called who recommended placement of a volar slab splint and follow-up in the next 24-48 hours in his clinic.  Patient was given prescription for pain medicine and his pain improved with IV pain medications.  Patient given instructions on return for any numbness, discoloration, worsening swelling, or worsened pain.  Patient voiced understanding of plan of care as well as follow-up instructions.  Patient had no other questions or concerns and was discharged in good condition.   Final Clinical Impressions(s) / ED Diagnoses   Final diagnoses:  Closed fracture of shaft of left ulna, unspecified fracture morphology, initial encounter    ED Discharge Orders        Ordered    oxyCODONE-acetaminophen (PERCOCET/ROXICET) 5-325 MG tablet  Every 4 hours PRN     06/27/17 2052      Clinical Impression: 1. Closed fracture of shaft of left ulna, unspecified fracture morphology, initial encounter     Disposition: Discharge  Condition: Good  I have discussed the results, Dx and Tx plan with the pt(& family if present). He/she/they expressed understanding and agree(s) with the plan. Discharge instructions discussed at great length. Strict return precautions discussed and pt &/or family have verbalized understanding of the instructions. No further questions at time of discharge.    Discharge Medication List as of 06/27/2017  8:56 PM      START taking these medications   Details  oxyCODONE-acetaminophen (PERCOCET/ROXICET) 5-325 MG tablet Take 1 tablet by mouth every 4 (four) hours as needed for severe pain., Starting Mon 06/27/2017, Print        Follow Up: Mack Hook, MD [address]. [place] [address] [phone]  Follow up my office will call you  tomorrow to make arrangements for  follow-up  Mack Hook, MD [address] [place] [address] [phone]       Stormey Wilborn, Canary Brim, MD 06/28/17 [phone]

## 2017-06-27 NOTE — ED Notes (Signed)
PT requesting something to drink. PT reminded he can not drink anything at this time.

## 2017-06-27 NOTE — ED Triage Notes (Signed)
Patient reports he was at work and left arm went through conveyer belt.  Erythema and obvious deformity noted to left arm.  No open wounds or bleeding noted.

## 2017-06-27 NOTE — ED Notes (Signed)
Pt verbalizes understanding of d/c instructions and denies any further needs at this time. 

## 2017-06-27 NOTE — ED Notes (Signed)
Patient transported to CT 

## 2017-06-27 NOTE — ED Notes (Signed)
ED Provider at bedside. 

## 2017-06-27 NOTE — Progress Notes (Signed)
Patient with mildly displaced/angulated left ulna shaft fracture.  Will be splinted in the ED.  My office we will contact him tomorrow to get the requisite information needed to obtain Worker's Compensation authorization for continued care.  I will plan to evaluate him in the office on Wednesday or Thursday, to evaluate the extent of swelling, and discuss with him a plan for operative treatment of his ulna, likely Monday or Tuesday of next week to allow the initial swelling to blossom and recede.  Keith Crouchave [name], MD Hand Surgery

## 2017-06-27 NOTE — ED Notes (Signed)
Pt returned from CT °

## 2017-06-29 ENCOUNTER — Other Ambulatory Visit: Payer: Self-pay | Admitting: Orthopedic Surgery

## 2017-06-29 ENCOUNTER — Encounter (HOSPITAL_BASED_OUTPATIENT_CLINIC_OR_DEPARTMENT_OTHER): Payer: Self-pay | Admitting: *Deleted

## 2017-06-29 ENCOUNTER — Other Ambulatory Visit: Payer: Self-pay

## 2017-07-01 NOTE — H&P (Signed)
Keith Copeland is an 27 y.o. male.   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  His left arm became caught in a conveyer between a roller in a belt, sustaining a fracture to the ulna and a crush injury to the soft tissues.  He has been taking some Percocet and nothing else, still having significant pain and some numbness.  Past Medical History:  Diagnosis Date  . Allergy    poison ivy/oak  . Duodenitis   . Dysphagia   . Esophageal ring   . Esophagitis   . GERD (gastroesophageal reflux disease)   . Headache   . Thyroid disease    hypothyroidism    Past Surgical History:  Procedure Laterality Date  . LAPAROSCOPIC GASTRIC SLEEVE RESECTION    . TONSILLECTOMY    . UPPER GASTROINTESTINAL ENDOSCOPY     with dilatation  . WISDOM TOOTH EXTRACTION      Family History  Problem Relation Age of Onset  . Breast cancer Paternal Grandmother   . Diabetes Paternal Grandmother   . Thyroid disease Brother        hyper/ hypothyroidism   . Colon cancer Neg Hx   . Esophageal cancer Neg Hx   . Pancreatic cancer Neg Hx   . Prostate cancer Neg Hx   . Rectal cancer Neg Hx   . Stomach cancer Neg Hx    Social History:  reports that he quit smoking about 6 years ago. His smoking use included cigarettes. He has a 0.30 pack-year smoking history. he has never used smokeless tobacco. He reports that he does not drink alcohol or use drugs.  Allergies: No Known Allergies  No medications prior to admission.    No results found for this or any previous visit (from the past 48 hour(s)). No results found.  Review of Systems  All other systems reviewed and are negative.   Height 5\' 9"  (1.753 m), weight 93.9 kg (207 lb). Physical Exam  Constitutional:  WD, WN, NAD HEENT:  NCAT, EOMI Neuro/Psych:  Alert & oriented to person,  place, and time; appropriate mood & affect Lymphatic: No generalized UE edema or lymphadenopathy Extremities / MSK:  Both UE are normal with respect to appearance, ranges of motion, joint stability, muscle strength/tone, sensation, & perfusion except as otherwise noted:  Left upper extremity is splinted with a longer volar splint and overlying Ace wrap.  The compartments are not tense.  Although he has intact light touch sensibility throughout, subjectively is diminished in the dorsal first web space and also a little bit in the ulnar distribution.  He is noted to be able to extend the index MCP, abduct the index finger, and flexes all the digits.    Labs / Xrays:  No radiographic studies obtained today.  Injury x-rays are reviewed, revealing a displaced and angulated fracture through the ulna shaft, near the junction of the middle and distal one thirds.  Assessment: Displaced angulated left ulnar shaft fracture, closed, with associated soft tissue crushing injury  Plan:  I discussed these findings with him and recommended surgical treatment to include open reduction and internal fixation of the ulna to pain and maintain a more acceptable anatomic alignment.  This should enhance his ability to rehab the injury which will be critical given the crush aspect to the soft tissues.  In  addition, these injuries can provide for much greater degrees of pain and dysfunction in large part due to the soft tissue response to the crushing component of the injury than even the fracture of the skeleton.  We will begin multidrug therapy to help with this, to include regularly taken Tylenol, meloxicam, Neurontin, and intermittent oxycodone and plan for surgery on Monday provided authorization is obtained.  That would be ideal.  If not we will have to delay until Tuesday.  Jodi Marbleavid A Haizley Cannella, MD 07/01/2017, 7:05 AM

## 2017-07-05 ENCOUNTER — Encounter (HOSPITAL_BASED_OUTPATIENT_CLINIC_OR_DEPARTMENT_OTHER)
Admission: RE | Disposition: A | Discharge status: Home / Self Care | Payer: Self-pay | Source: Ambulatory Visit | Attending: Orthopedic Surgery

## 2017-07-05 ENCOUNTER — Ambulatory Visit (HOSPITAL_BASED_OUTPATIENT_CLINIC_OR_DEPARTMENT_OTHER): Admitting: Certified Registered Nurse Anesthetist

## 2017-07-05 ENCOUNTER — Encounter (HOSPITAL_BASED_OUTPATIENT_CLINIC_OR_DEPARTMENT_OTHER): Payer: Self-pay

## 2017-07-05 ENCOUNTER — Other Ambulatory Visit: Payer: Self-pay

## 2017-07-05 ENCOUNTER — Ambulatory Visit (HOSPITAL_BASED_OUTPATIENT_CLINIC_OR_DEPARTMENT_OTHER)
Admission: RE | Admit: 2017-07-05 | Discharge: 2017-07-05 | Disposition: A | Discharge status: Home / Self Care | Source: Ambulatory Visit | Attending: Orthopedic Surgery | Admitting: Orthopedic Surgery

## 2017-07-05 ENCOUNTER — Ambulatory Visit (HOSPITAL_COMMUNITY)

## 2017-07-05 DIAGNOSIS — W230XXA Caught, crushed, jammed, or pinched between moving objects, initial encounter: Secondary | ICD-10-CM | POA: Diagnosis not present

## 2017-07-05 DIAGNOSIS — Y990 Civilian activity done for income or pay: Secondary | ICD-10-CM | POA: Insufficient documentation

## 2017-07-05 DIAGNOSIS — Z87891 Personal history of nicotine dependence: Secondary | ICD-10-CM | POA: Insufficient documentation

## 2017-07-05 DIAGNOSIS — S52202A Unspecified fracture of shaft of left ulna, initial encounter for closed fracture: Secondary | ICD-10-CM | POA: Diagnosis present

## 2017-07-05 DIAGNOSIS — Z4789 Encounter for other orthopedic aftercare: Secondary | ICD-10-CM

## 2017-07-05 HISTORY — PX: ORIF ULNAR FRACTURE: SHX5417

## 2017-07-05 SURGERY — OPEN REDUCTION INTERNAL FIXATION (ORIF) ULNAR FRACTURE
Anesthesia: General | Site: Arm Lower | Laterality: Left

## 2017-07-05 MED ORDER — MIDAZOLAM HCL 2 MG/2ML IJ SOLN
1.0000 mg | INTRAMUSCULAR | Status: DC | PRN
  Administered 2017-07-05: 2 mg via INTRAVENOUS

## 2017-07-05 MED ORDER — FENTANYL CITRATE (PF) 100 MCG/2ML IJ SOLN
INTRAMUSCULAR | Status: AC
  Filled 2017-07-05: qty 2

## 2017-07-05 MED ORDER — OXYCODONE HCL 5 MG/5ML PO SOLN: 5.0000 mg | Freq: Once | ORAL | Status: DC | PRN

## 2017-07-05 MED ORDER — OXYCODONE HCL 5 MG PO TABS: 5.0000 mg | ORAL_TABLET | Freq: Once | ORAL | Status: DC | PRN

## 2017-07-05 MED ORDER — FENTANYL CITRATE (PF) 100 MCG/2ML IJ SOLN
50.0000 ug | INTRAMUSCULAR | Status: DC | PRN
  Administered 2017-07-05: 100 ug via INTRAVENOUS

## 2017-07-05 MED ORDER — ONDANSETRON HCL 4 MG/2ML IJ SOLN: 4.0000 mg | Freq: Once | INTRAMUSCULAR | Status: DC | PRN

## 2017-07-05 MED ORDER — MIDAZOLAM HCL 2 MG/2ML IJ SOLN
INTRAMUSCULAR | Status: AC
  Filled 2017-07-05: qty 2

## 2017-07-05 MED ORDER — LACTATED RINGERS IV SOLN: INTRAVENOUS | Status: DC

## 2017-07-05 MED ORDER — ONDANSETRON HCL 4 MG/2ML IJ SOLN
INTRAMUSCULAR | Status: AC
  Filled 2017-07-05: qty 2

## 2017-07-05 MED ORDER — PROPOFOL 10 MG/ML IV BOLUS
INTRAVENOUS | Status: DC | PRN
  Administered 2017-07-05: 200 mg via INTRAVENOUS

## 2017-07-05 MED ORDER — CEFAZOLIN SODIUM-DEXTROSE 2-4 GM/100ML-% IV SOLN
2.0000 g | INTRAVENOUS | Status: AC
  Administered 2017-07-05: 2 g via INTRAVENOUS

## 2017-07-05 MED ORDER — EPHEDRINE 5 MG/ML INJ
INTRAVENOUS | Status: AC
  Filled 2017-07-05: qty 10

## 2017-07-05 MED ORDER — SCOPOLAMINE 1 MG/3DAYS TD PT72: 1.0000 | MEDICATED_PATCH | Freq: Once | TRANSDERMAL | Status: DC | PRN

## 2017-07-05 MED ORDER — PROPOFOL 10 MG/ML IV BOLUS
INTRAVENOUS | Status: AC
  Filled 2017-07-05: qty 20

## 2017-07-05 MED ORDER — EPHEDRINE SULFATE-NACL 50-0.9 MG/10ML-% IV SOSY
PREFILLED_SYRINGE | INTRAVENOUS | Status: DC | PRN
  Administered 2017-07-05: 15 mg via INTRAVENOUS

## 2017-07-05 MED ORDER — DEXAMETHASONE SODIUM PHOSPHATE 10 MG/ML IJ SOLN
INTRAMUSCULAR | Status: AC
  Filled 2017-07-05: qty 1

## 2017-07-05 MED ORDER — CEFAZOLIN SODIUM-DEXTROSE 2-4 GM/100ML-% IV SOLN
INTRAVENOUS | Status: AC
  Filled 2017-07-05: qty 100

## 2017-07-05 MED ORDER — ONDANSETRON HCL 4 MG/2ML IJ SOLN
INTRAMUSCULAR | Status: DC | PRN
  Administered 2017-07-05: 4 mg via INTRAVENOUS

## 2017-07-05 MED ORDER — LIDOCAINE 2% (20 MG/ML) 5 ML SYRINGE
INTRAMUSCULAR | Status: DC | PRN
  Administered 2017-07-05: 80 mg via INTRAVENOUS

## 2017-07-05 MED ORDER — LACTATED RINGERS IV SOLN
INTRAVENOUS | Status: DC
  Administered 2017-07-05 (×2): via INTRAVENOUS

## 2017-07-05 MED ORDER — HYDROMORPHONE HCL 1 MG/ML IJ SOLN: 0.2500 mg | INTRAMUSCULAR | Status: DC | PRN

## 2017-07-05 MED ORDER — DEXAMETHASONE SODIUM PHOSPHATE 10 MG/ML IJ SOLN
INTRAMUSCULAR | Status: DC | PRN
  Administered 2017-07-05: 10 mg via INTRAVENOUS

## 2017-07-05 SURGICAL SUPPLY — 62 items
BENZOIN TINCTURE PRP APPL 2/3 (GAUZE/BANDAGES/DRESSINGS) ×3 IMPLANT
BIT DRILL 2.5X2.75 QC CALB (BIT) ×3 IMPLANT
BIT DRILL CALIBRATED 2.7 (BIT) ×2 IMPLANT
BIT DRILL CALIBRATED 2.7MM (BIT) ×1
BLADE MINI RND TIP GREEN BEAV (BLADE) IMPLANT
BLADE SURG 15 STRL LF DISP TIS (BLADE) ×1 IMPLANT
BLADE SURG 15 STRL SS (BLADE) ×2
BNDG COHESIVE 4X5 TAN STRL (GAUZE/BANDAGES/DRESSINGS) ×3 IMPLANT
BNDG ESMARK 4X9 LF (GAUZE/BANDAGES/DRESSINGS) ×3 IMPLANT
BNDG GAUZE ELAST 4 BULKY (GAUZE/BANDAGES/DRESSINGS) ×3 IMPLANT
BRUSH SCRUB EZ PLAIN DRY (MISCELLANEOUS) IMPLANT
CANISTER SUCT 1200ML W/VALVE (MISCELLANEOUS) ×3 IMPLANT
CHLORAPREP W/TINT 26ML (MISCELLANEOUS) ×3 IMPLANT
CLOSURE WOUND 1/2 X4 (GAUZE/BANDAGES/DRESSINGS) ×1
CORD BIPOLAR FORCEPS 12FT (ELECTRODE) ×3 IMPLANT
COVER BACK TABLE 60X90IN (DRAPES) ×3 IMPLANT
COVER MAYO STAND STRL (DRAPES) ×3 IMPLANT
CUFF TOURNIQUET SINGLE 18IN (TOURNIQUET CUFF) ×3 IMPLANT
CUFF TOURNIQUET SINGLE 24IN (TOURNIQUET CUFF) IMPLANT
DRAPE C-ARM 42X72 X-RAY (DRAPES) ×3 IMPLANT
DRAPE EXTREMITY T 121X128X90 (DRAPE) ×3 IMPLANT
DRAPE SURG 17X23 STRL (DRAPES) ×3 IMPLANT
DRSG ADAPTIC 3X8 NADH LF (GAUZE/BANDAGES/DRESSINGS) ×3 IMPLANT
DRSG EMULSION OIL 3X3 NADH (GAUZE/BANDAGES/DRESSINGS) IMPLANT
GAUZE SPONGE 4X4 12PLY STRL LF (GAUZE/BANDAGES/DRESSINGS) ×3 IMPLANT
GLOVE BIO SURGEON STRL SZ 6.5 (GLOVE) ×2 IMPLANT
GLOVE BIO SURGEON STRL SZ7.5 (GLOVE) ×3 IMPLANT
GLOVE BIO SURGEONS STRL SZ 6.5 (GLOVE) ×1
GLOVE BIOGEL PI IND STRL 7.0 (GLOVE) ×3 IMPLANT
GLOVE BIOGEL PI IND STRL 8 (GLOVE) ×1 IMPLANT
GLOVE BIOGEL PI INDICATOR 7.0 (GLOVE) ×6
GLOVE BIOGEL PI INDICATOR 8 (GLOVE) ×2
GLOVE ECLIPSE 6.5 STRL STRAW (GLOVE) ×3 IMPLANT
GOWN STRL REUS W/ TWL LRG LVL3 (GOWN DISPOSABLE) ×2 IMPLANT
GOWN STRL REUS W/TWL LRG LVL3 (GOWN DISPOSABLE) ×4
GOWN STRL REUS W/TWL XL LVL3 (GOWN DISPOSABLE) ×3 IMPLANT
NEEDLE HYPO 25X1 1.5 SAFETY (NEEDLE) IMPLANT
NS IRRIG 1000ML POUR BTL (IV SOLUTION) ×3 IMPLANT
PACK BASIN DAY SURGERY FS (CUSTOM PROCEDURE TRAY) ×3 IMPLANT
PADDING CAST ABS 4INX4YD NS (CAST SUPPLIES) ×2
PADDING CAST ABS COTTON 4X4 ST (CAST SUPPLIES) ×1 IMPLANT
PENCIL BUTTON HOLSTER BLD 10FT (ELECTRODE) ×3 IMPLANT
PLATE LOCK COMP 7H FOOT (Plate) ×3 IMPLANT
SCREW CORTICAL 3.5MM  16MM (Screw) ×4 IMPLANT
SCREW CORTICAL 3.5MM 16MM (Screw) ×2 IMPLANT
SCREW LOCK CORT STAR 3.5X12 (Screw) ×12 IMPLANT
SLEEVE SCD COMPRESS KNEE MED (MISCELLANEOUS) ×3 IMPLANT
STOCKINETTE 6  STRL (DRAPES) ×2
STOCKINETTE 6 STRL (DRAPES) ×1 IMPLANT
STRIP CLOSURE SKIN 1/2X4 (GAUZE/BANDAGES/DRESSINGS) ×2 IMPLANT
SUCTION FRAZIER HANDLE 10FR (MISCELLANEOUS) ×2
SUCTION TUBE FRAZIER 10FR DISP (MISCELLANEOUS) ×1 IMPLANT
SUT VIC AB 2-0 CT3 27 (SUTURE) ×3 IMPLANT
SUT VICRYL 4-0 PS2 18IN ABS (SUTURE) IMPLANT
SUT VICRYL RAPIDE 4/0 PS 2 (SUTURE) ×3 IMPLANT
SYR 10ML LL (SYRINGE) IMPLANT
SYR BULB 3OZ (MISCELLANEOUS) ×3 IMPLANT
TOWEL OR 17X24 6PK STRL BLUE (TOWEL DISPOSABLE) ×3 IMPLANT
TOWEL OR NON WOVEN STRL DISP B (DISPOSABLE) ×3 IMPLANT
TUBE CONNECTING 20'X1/4 (TUBING) ×1
TUBE CONNECTING 20X1/4 (TUBING) ×2 IMPLANT
UNDERPAD 30X30 (UNDERPADS AND DIAPERS) ×3 IMPLANT

## 2017-07-05 NOTE — Anesthesia Postprocedure Evaluation (Signed)
Anesthesia Post Note  Patient: Keith Copeland  Procedure(s) Performed: OPEN TREATMENT OF LEFT ULNA SHAFT FRACTURE (Left Arm Lower)     Patient location during evaluation: PACU Anesthesia Type: General Level of consciousness: awake and alert, oriented, patient cooperative and awake Pain management: pain level controlled Vital Signs Assessment: post-procedure vital signs reviewed and stable Respiratory status: spontaneous breathing, nonlabored ventilation, respiratory function stable and patient connected to nasal cannula oxygen Cardiovascular status: blood pressure returned to baseline and stable Postop Assessment: no apparent nausea or vomiting Anesthetic complications: no    Last Vitals:  Vitals:   07/05/17 1600 07/05/17 1616  BP: 135/80 129/78  Pulse: 88 72  Resp: (!) 23 18  Temp:  37 C  SpO2: 100% 99%    Last Pain:  Vitals:   07/05/17 1616  TempSrc: Oral  PainSc: 0-No pain                 Cecile HearingStephen Edward Anuhea Gassner

## 2017-07-05 NOTE — Interval H&P Note (Signed)
History and Physical Interval Note:  07/05/2017 1:20 PM  Gregary SignsSean Langer  has presented today for surgery, with the diagnosis of LEFT ULNA SHAFT FRACTURE S52.222A  The various methods of treatment have been discussed with the patient and family. After consideration of risks, benefits and other options for treatment, the patient has consented to  Procedure(s): OPEN TREATMENT OF LEFT ULNA SHAFT FRACTURE (Left) as a surgical intervention .  The patient's history has been reviewed, patient examined, no change in status, stable for surgery.  I have reviewed the patient's chart and labs.  Questions were answered to the patient's satisfaction.     Jodi Marbleavid A Bexlee Bergdoll

## 2017-07-05 NOTE — Anesthesia Procedure Notes (Signed)
Procedure Name: LMA Insertion Date/Time: 07/05/2017 2:46 PM Performed by: Pearson Grippeobertson, Carys Malina M, CRNA Pre-anesthesia Checklist: Patient identified, Emergency Drugs available, Suction available and Patient being monitored Patient Re-evaluated:Patient Re-evaluated prior to induction Oxygen Delivery Method: Circle system utilized Preoxygenation: Pre-oxygenation with 100% oxygen Induction Type: IV induction Ventilation: Mask ventilation without difficulty LMA: LMA inserted LMA Size: 4.0 Number of attempts: 1 Airway Equipment and Method: Bite block Placement Confirmation: positive ETCO2 Tube secured with: Tape Dental Injury: Teeth and Oropharynx as per pre-operative assessment

## 2017-07-05 NOTE — Transfer of Care (Signed)
Immediate Anesthesia Transfer of Care Note  Patient: Keith Copeland  Procedure(s) Performed: OPEN TREATMENT OF LEFT ULNA SHAFT FRACTURE (Left Arm Lower)  Patient Location: PACU  Anesthesia Type:GA combined with regional for post-op pain  Level of Consciousness: awake, alert  and oriented  Airway & Oxygen Therapy: Patient Spontanous Breathing and Patient connected to face mask oxygen  Post-op Assessment: Report given to RN and Post -op Vital signs reviewed and stable  Post vital signs: Reviewed and stable  Last Vitals:  Vitals:   07/05/17 1341 07/05/17 1345  BP: 122/77 120/86  Pulse: 61 (!) 54  Resp: 11 (!) 9  Temp:    SpO2: 100% 100%    Last Pain:  Vitals:   07/05/17 1237  TempSrc: Oral  PainSc: 6          Complications: No apparent anesthesia complications

## 2017-07-05 NOTE — Anesthesia Preprocedure Evaluation (Signed)
Anesthesia Evaluation  Patient identified by MRN, date of birth, ID band Patient awake    Reviewed: Allergy & Precautions, NPO status , Patient's Chart, lab work & pertinent test results  Airway Mallampati: II  TM Distance: >3 FB Neck ROM: Full    Dental  (+) Teeth Intact, Dental Advisory Given   Pulmonary former smoker,    breath sounds clear to auscultation       Cardiovascular  Rhythm:Regular Rate:Normal     Neuro/Psych    GI/Hepatic   Endo/Other    Renal/GU      Musculoskeletal   Abdominal   Peds  Hematology   Anesthesia Other Findings   Reproductive/Obstetrics                             Anesthesia Physical Anesthesia Plan  ASA: II  Anesthesia Plan: General   Post-op Pain Management:  Regional for Post-op pain   Induction: Intravenous  PONV Risk Score and Plan: Ondansetron and Dexamethasone  Airway Management Planned: LMA  Additional Equipment:   Intra-op Plan:   Post-operative Plan:   Informed Consent: I have reviewed the patients History and Physical, chart, labs and discussed the procedure including the risks, benefits and alternatives for the proposed anesthesia with the patient or authorized representative who has indicated his/her understanding and acceptance.     Dental advisory given  Plan Discussed with: CRNA and Anesthesiologist  Anesthesia Plan Comments:         Anesthesia Quick Evaluation  

## 2017-07-05 NOTE — Op Note (Signed)
07/05/2017  1:21 PM  PATIENT:  Keith Copeland  27 y.o. male  PRE-OPERATIVE DIAGNOSIS:  Left ulna shaft fx  POST-OPERATIVE DIAGNOSIS:  Same  PROCEDURE:  ORIF L ulna shaft fx  SURGEON: Cliffton Astersavid A. Janee Mornhompson, MD  PHYSICIAN ASSISTANT: Danielle RankinKirsten Schrader, OPA-C  ANESTHESIA:  regional and general  SPECIMENS:  None  DRAINS: None  EBL:  less than 50 mL  PREOPERATIVE INDICATIONS:  Keith Copeland is a  27 y.o. male with a displaced left ulna shaft fracture  The risks benefits and alternatives were discussed with the patient preoperatively including but not limited to the risks of infection, bleeding, nerve injury, cardiopulmonary complications, the need for revision surgery, among others, and the patient verbalized understanding and consented to proceed.  OPERATIVE IMPLANTS: Biomet 3.365mm small frag plate/screws  OPERATIVE PROCEDURE: After receiving prophylactic antibiotics and a regional block, the patient was escorted to the operative theatre and placed in a supine position. General anesthesia was administered.   A surgical "time-out" was performed during which the planned procedure, proposed operative site, and the correct patient identity were compared to the operative consent and agreement confirmed by the circulating nurse according to current facility policy. Following application of a tourniquet to the operative extremity, the exposed skin was pre-scrubbed with Hibiclens scrub brush and then was prepped with Chloraprep and draped in the usual sterile fashion. The limb was exsanguinated with an Esmarch bandage and the tourniquet inflated to approximately 100mmHg higher than systolic BP.   A direct linear incision was made on the ulnar subcutaneous border of the forearm, centered on the ulnar fracture site.  Skin and subcutaneous tissues were dissected with a combination of sharp and blunt spreading dissection.  This remained proximal to the expected crossing course of the dorsal sensory branch of  the ulnar nerve.  The fascia was split between the flexor and extensor compartments and the periosteum reflected subperiosteally off of the volar surface of the ulna.  The fracture was manipulated with clamps and reduced, and a 7 hole plate applied to the volar surface.  Reduction was confirmed fluoroscopically and then the plate was applied, first securing it to one side and then providing compression through dynamic compression mechanism.  There is a combination of 2 nonlocking and 4 locking screws placed.  Alignment was judged to be near anatomic.  In addition, the radius appeared to have some bowing.  He did not appear to be plastic deformation and in fact, there was no overt fracture and the radius could not be manipulated even with fairly firm pressure applied into a different shape or alignment.  Forearm rotation was still fairly full, lacking a little supination, but at the moment unable to compare to the contralateral side to see if this was symmetric or not.  2-0 Vicryl running suture was used to loosely reapproximate the fascial split.  Tourniquet was released and additional hemostasis obtained and the skin was closed with 2-0 Vicryl deep dermal buried sutures followed by running 4-0 Vicryl Rapide horizontal mattress suture in the skin. A bulky dressing with a volar plaster component was applied and the patient was taken to the recovery room in stable condition.  DISPOSITION: The patient will be discharged home today with typical post-op instructions, returning in 10-15 days for reevaluation with new x-rays of the affected wrist out of the splint (3-V) and then transition to therapy to have a custom splint constructed and begin rehabilitation.

## 2017-07-05 NOTE — Anesthesia Procedure Notes (Addendum)
Anesthesia Regional Block: Supraclavicular block   Pre-Anesthetic Checklist: ,, timeout performed, Correct Patient, Correct Site, Correct Laterality, Correct Procedure, Correct Position, site marked, Risks and benefits discussed,  Surgical consent,  Pre-op evaluation,  At surgeon's request and post-op pain management  Laterality: Left  Prep: chloraprep       Needles:  Injection technique: Single-shot  Needle Type: Stimulator Needle - 80          Additional Needles:   Procedures:, nerve stimulator,,,,,,,  Narrative:  Start time: 07/05/2017 1:40 PM End time: 07/05/2017 1:45 PM Injection made incrementally with aspirations every 5 mL.  Performed by: Personally  Anesthesiologist: Kipp BroodJoslin, Jaxsyn Catalfamo, MD  Additional Notes: 20 cc 0.75% Ropivacaine injected easily

## 2017-07-05 NOTE — Progress Notes (Signed)
Assisted Dr. Joslin with left, ultrasound guided, supraclavicular block. Side rails up, monitors on throughout procedure. See vital signs in flow sheet. Tolerated Procedure well. 

## 2017-07-05 NOTE — Discharge Instructions (Signed)
°Post Anesthesia Home Care Instructions ° °Activity: °Get plenty of rest for the remainder of the day. A responsible individual must stay with you for 24 hours following the procedure.  °For the next 24 hours, DO NOT: °-Drive a car °-Operate machinery °-Drink alcoholic beverages °-Take any medication unless instructed by your physician °-Make any legal decisions or sign important papers. ° °Meals: °Start with liquid foods such as gelatin or soup. Progress to regular foods as tolerated. Avoid greasy, spicy, heavy foods. If nausea and/or vomiting occur, drink only clear liquids until the nausea and/or vomiting subsides. Call your physician if vomiting continues. ° °Special Instructions/Symptoms: °Your throat may feel dry or sore from the anesthesia or the breathing tube placed in your throat during surgery. If this causes discomfort, gargle with warm salt water. The discomfort should disappear within 24 hours. ° °If you had a scopolamine patch placed behind your ear for the management of post- operative nausea and/or vomiting: ° °1. The medication in the patch is effective for 72 hours, after which it should be removed.  Wrap patch in a tissue and discard in the trash. Wash hands thoroughly with soap and water. °2. You may remove the patch earlier than 72 hours if you experience unpleasant side effects which may include dry mouth, dizziness or visual disturbances. °3. Avoid touching the patch. Wash your hands with soap and water after contact with the patch. °  ° °Post Anesthesia Home Care Instructions ° °Activity: °Get plenty of rest for the remainder of the day. A responsible individual must stay with you for 24 hours following the procedure.  °For the next 24 hours, DO NOT: °-Drive a car °-Operate machinery °-Drink alcoholic beverages °-Take any medication unless instructed by your physician °-Make any legal decisions or sign important papers. ° °Meals: °Start with liquid foods such as gelatin or soup. Progress to  regular foods as tolerated. Avoid greasy, spicy, heavy foods. If nausea and/or vomiting occur, drink only clear liquids until the nausea and/or vomiting subsides. Call your physician if vomiting continues. ° °Special Instructions/Symptoms: °Your throat may feel dry or sore from the anesthesia or the breathing tube placed in your throat during surgery. If this causes discomfort, gargle with warm salt water. The discomfort should disappear within 24 hours. ° °If you had a scopolamine patch placed behind your ear for the management of post- operative nausea and/or vomiting: ° °1. The medication in the patch is effective for 72 hours, after which it should be removed.  Wrap patch in a tissue and discard in the trash. Wash hands thoroughly with soap and water. °2. You may remove the patch earlier than 72 hours if you experience unpleasant side effects which may include dry mouth, dizziness or visual disturbances. °3. Avoid touching the patch. Wash your hands with soap and water after contact with the patch. °  ° ° ° °Regional Anesthesia Blocks ° °1. Numbness or the inability to move the "blocked" extremity may last from 3-48 hours after placement. The length of time depends on the medication injected and your individual response to the medication. If the numbness is not going away after 48 hours, call your surgeon. ° °2. The extremity that is blocked will need to be protected until the numbness is gone and the  Strength has returned. Because you cannot feel it, you will need to take extra care to avoid injury. Because it may be weak, you may have difficulty moving it or using it. You may not know what   position it is in without looking at it while the block is in effect.  3. For blocks in the legs and feet, returning to weight bearing and walking needs to be done carefully. You will need to wait until the numbness is entirely gone and the strength has returned. You should be able to move your leg and foot normally  before you try and bear weight or walk. You will need someone to be with you when you first try to ensure you do not fall and possibly risk injury.  4. Bruising and tenderness at the needle site are common side effects and will resolve in a few days.  5. Persistent numbness or new problems with movement should be communicated to the surgeon or the [facility] [phone])/ [facility]  [phone]).   Discharge Instructions   You have a dressing with a plaster splint incorporated in it. Move your fingers as much as possible, making a full fist and fully opening the fist. Elevate your hand to reduce pain & swelling of the digits.  Ice over the operative site may be helpful to reduce pain & swelling.  DO NOT USE HEAT. Pain medicine was prescribed for you in the office that you currently have at home.  Take the 650 mg of tylenol every 6 hours for pain. Take the oxycodone additionally if you should have severe breakthrough pain. Leave the dressing in place until you return to our office.  You may shower, but keep the bandage clean & dry.  You may drive a car when you are off of prescription pain medications and can safely control your vehicle with both hands. Call our office to arrange a follow up for 10-15 days from the date of surgery.   Please call [phone] during normal business hours or [phone] after hours for any problems. Including the following:  - excessive redness of the incisions - drainage for more than 4 days - fever of more than 101.5 F  *Please note that pain medications will not be refilled after hours or on weekends.  WORK STATUS: You may return to work on 07/11/2017 with no work with the left hand. Restrictions are in place until follow up post operative appointment.

## 2017-07-06 ENCOUNTER — Encounter (HOSPITAL_BASED_OUTPATIENT_CLINIC_OR_DEPARTMENT_OTHER): Payer: Self-pay | Admitting: Orthopedic Surgery

## 2017-08-25 ENCOUNTER — Encounter: Payer: Self-pay | Admitting: Internal Medicine

## 2018-04-15 IMAGING — DX DG ELBOW COMPLETE 3+V*L*
3 series · 3 of 3 positions shown · non-contrast
Comparison: None.

CLINICAL DATA: Left arm injury.  Went through a conveyor belt.

EXAM:
LEFT HAND - COMPLETE 3+ VIEW; LEFT FOREARM - 2 VIEW; LEFT ELBOW -
COMPLETE 3+ VIEW

[elbow ap]
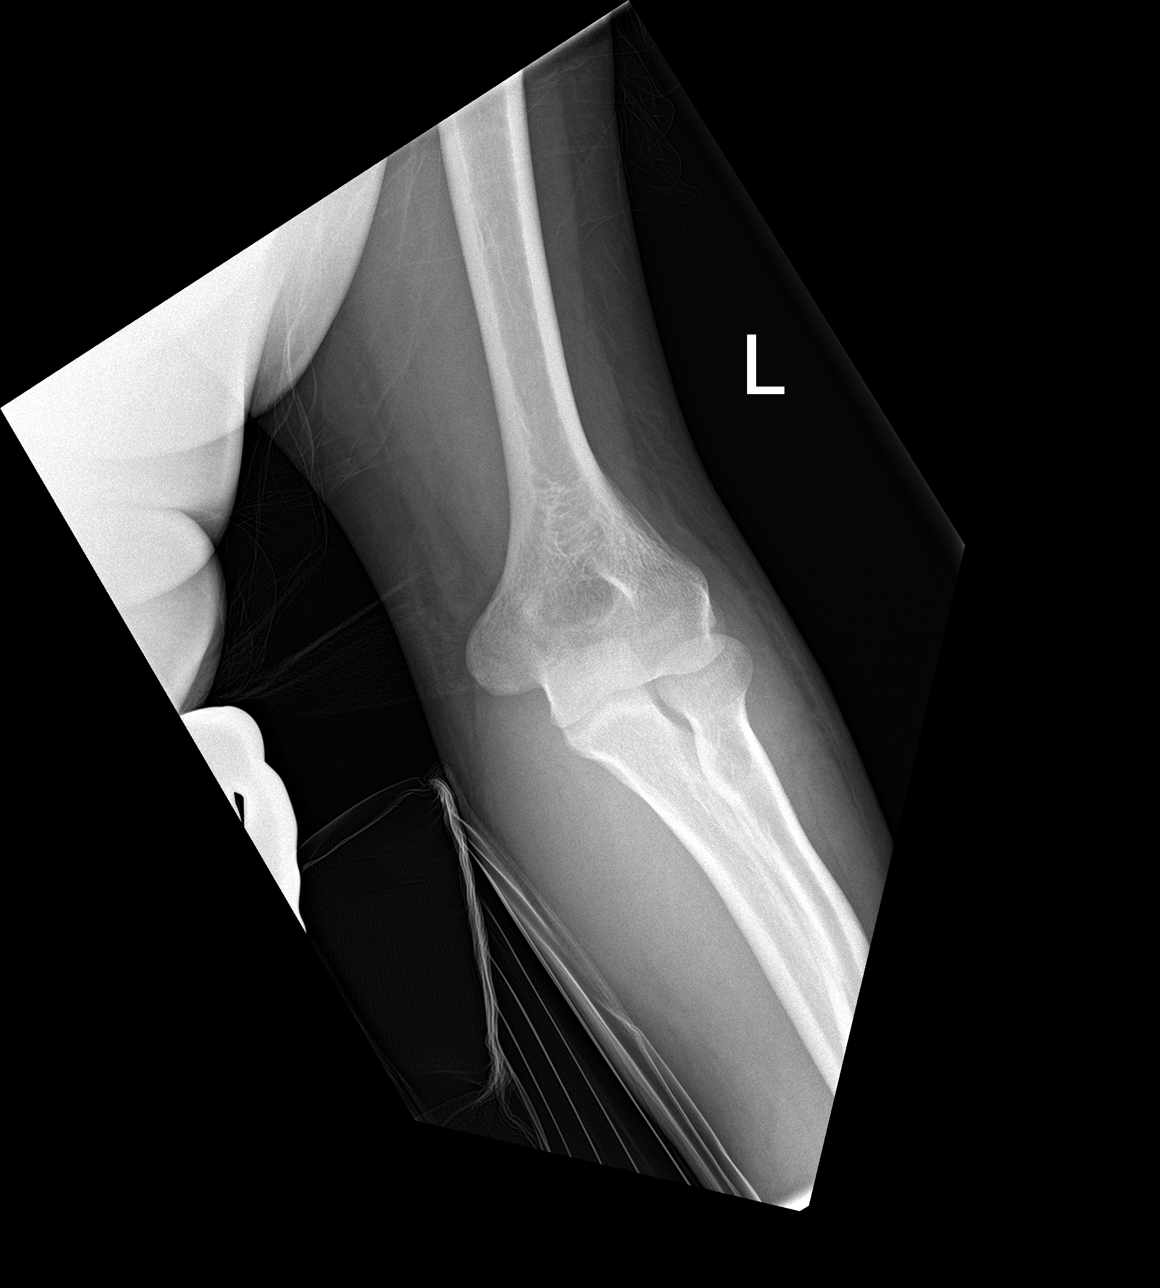

[elbow obl (1 of 2)]
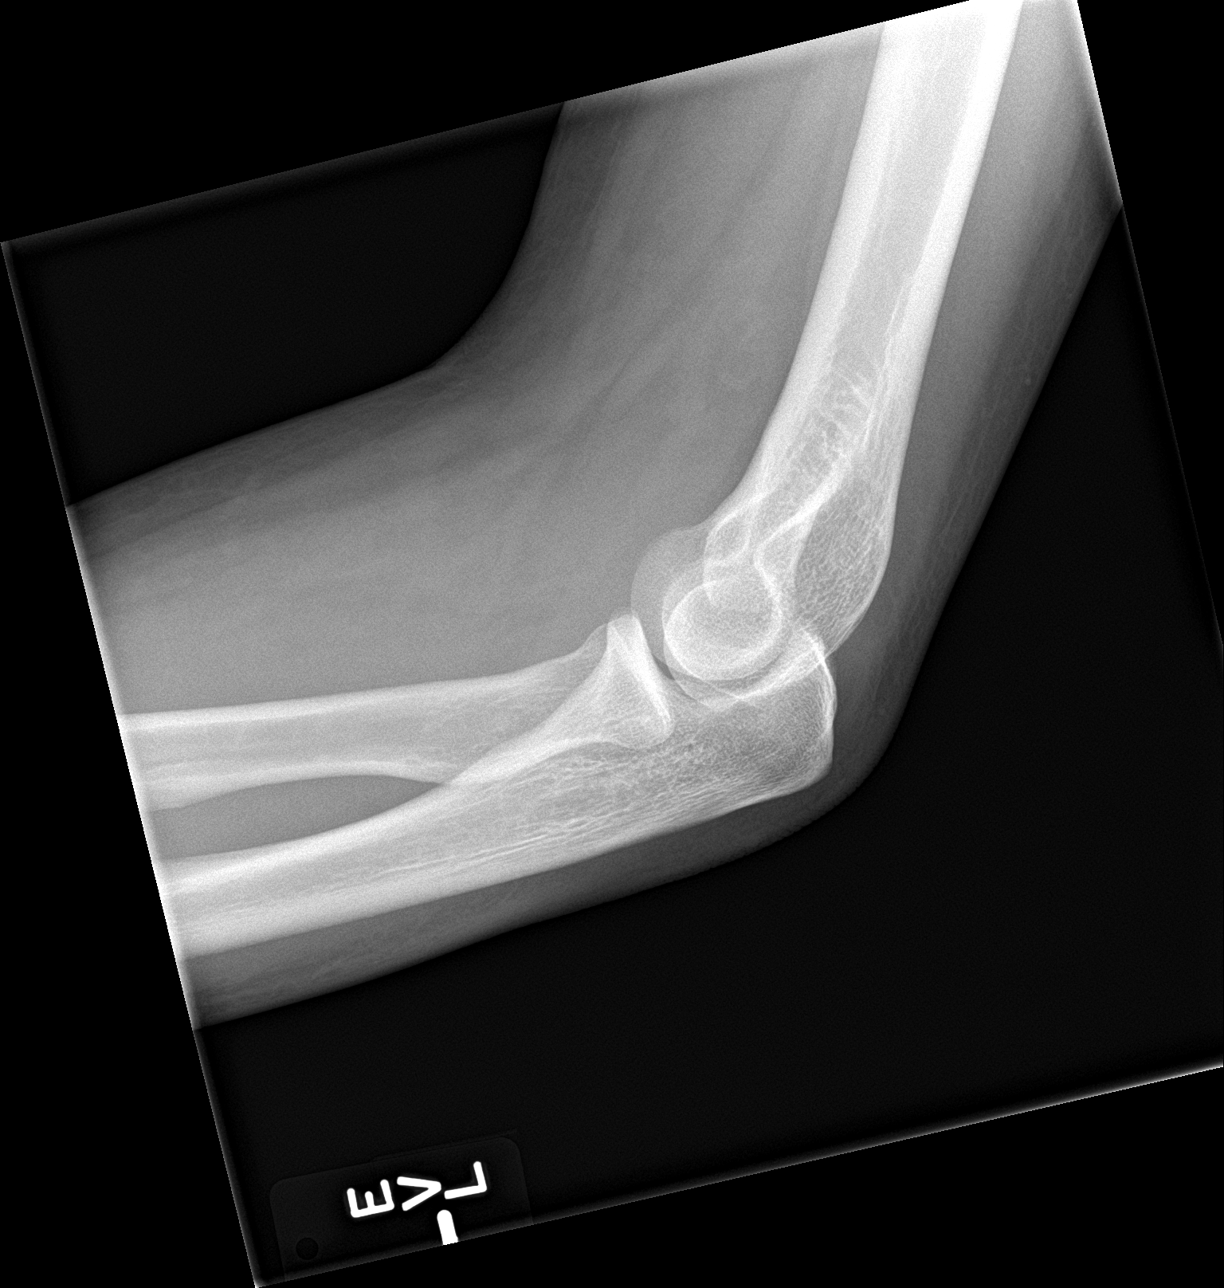

[elbow obl (2 of 2)]
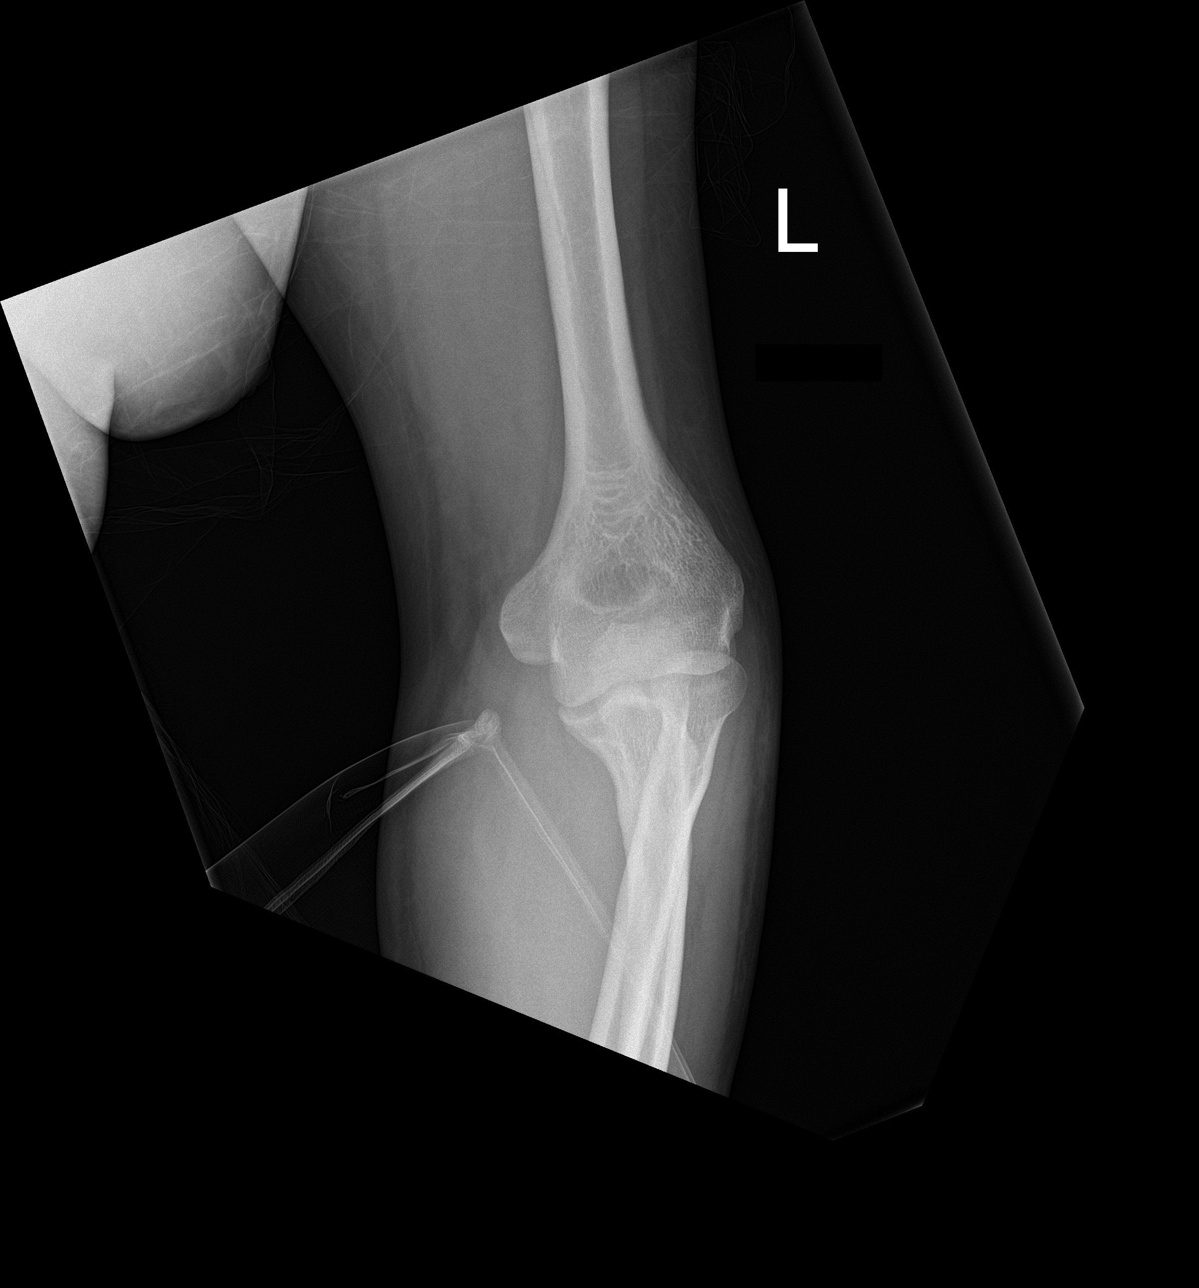

[3 of 3 positions shown; findings below may reference images not displayed]

FINDINGS: Left hand: No acute fracture or dislocation. Joint spaces are
preserved. Bone mineralization is normal. Soft tissues are
unremarkable.

Left forearm: There is a transverse fracture through the distal
ulnar diaphysis, with 4 mm medial displacement and slight volar
angulation. Bone mineralization is normal. Diffuse soft tissue
swelling. No radiopaque foreign body identified.

Left elbow: No acute fracture or dislocation. No joint effusion.
Bone mineralization is normal. Soft tissues are unremarkable.
IMPRESSION: 1. Minimally displaced transverse fracture through the distal ulnar
diaphysis.
2. No acute osseous abnormality of the hand or elbow. No radiopaque
foreign body identified.

## 2018-04-23 IMAGING — RF DG C-ARM 61-120 MIN
1 series · 2 of 2 positions shown · non-contrast
Comparison: Radiographs June 27, 2017.

CLINICAL DATA: Internal reduction of left ulnar fracture.

EXAM:
LEFT WRIST - 2 VIEW; DG C-ARM 61-120 MIN
FLUOROSCOPY TIME:  13 seconds.

[Series 1: run · 2 of 2 slices shown]
[im 1/2]
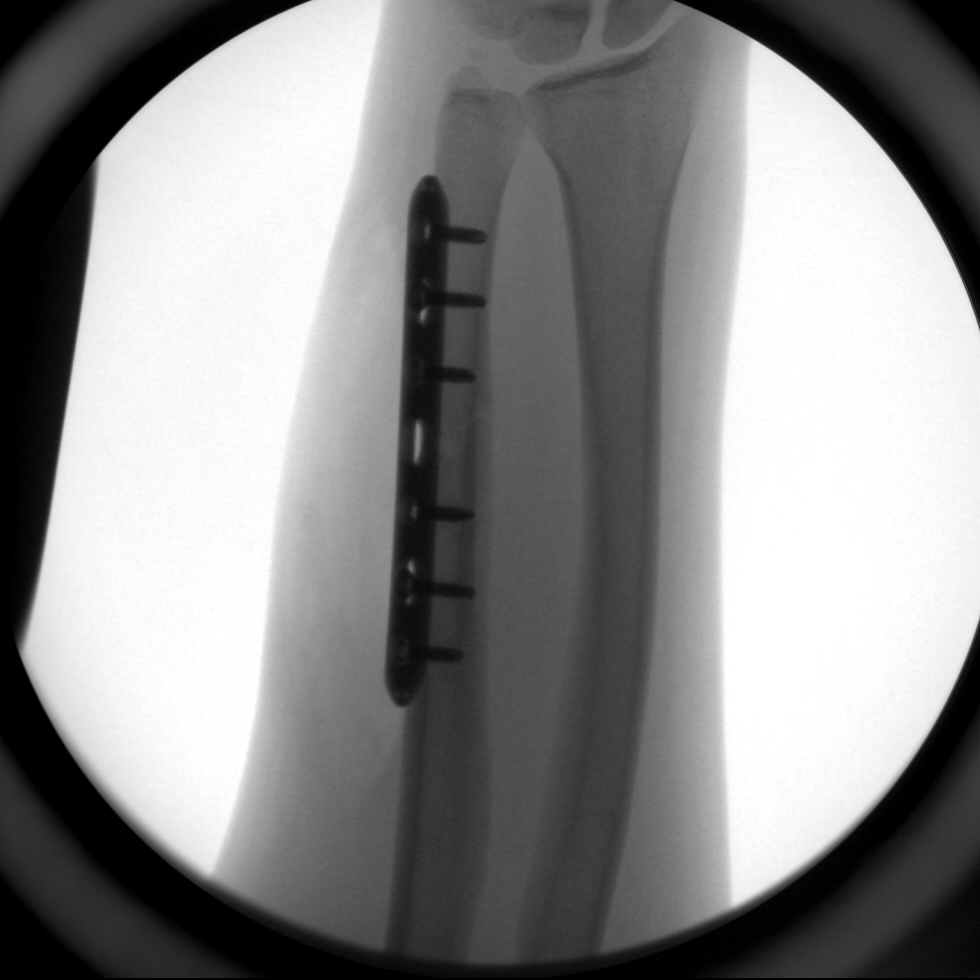
[im 2/2]
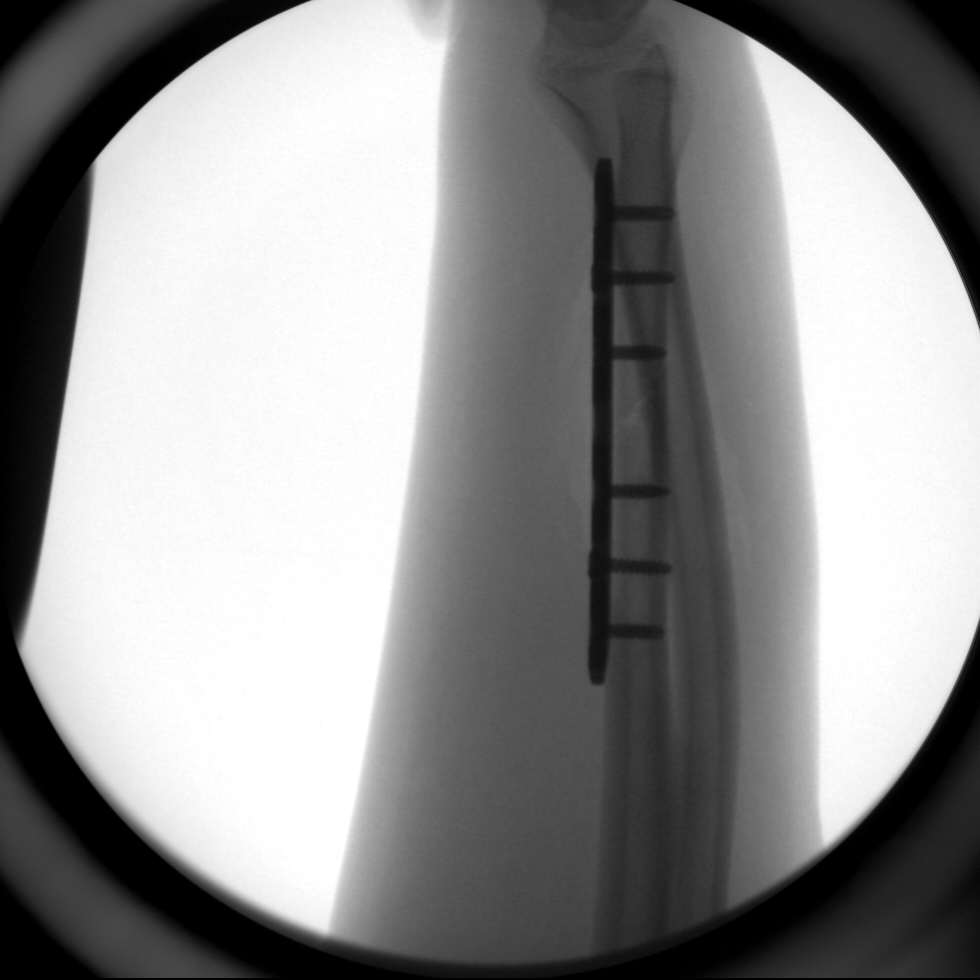

[2 of 2 positions shown; findings below may reference images not displayed]

FINDINGS: Two intraoperative fluoroscopic images were submitted for review.
These demonstrate surgical internal fixation of distal left ulnar
fracture. Good alignment of fracture components is noted.
IMPRESSION: Status post internal fixation of distal left ulnar fracture.
